# Patient Record
Sex: Male | Born: 1965 | Race: White | Hispanic: No | Marital: Married | Smoking: Never smoker
Health system: Southern US, Community
[De-identification: ages and names within clinical notes are randomized; demographics above are authoritative.]

## PROBLEM LIST (undated history)

## (undated) DIAGNOSIS — K219 Gastro-esophageal reflux disease without esophagitis: Secondary | ICD-10-CM

## (undated) DIAGNOSIS — R1011 Right upper quadrant pain: Secondary | ICD-10-CM

## (undated) DIAGNOSIS — R194 Change in bowel habit: Secondary | ICD-10-CM

## (undated) DIAGNOSIS — F32A Depression, unspecified: Secondary | ICD-10-CM

## (undated) DIAGNOSIS — K641 Second degree hemorrhoids: Secondary | ICD-10-CM

## (undated) DIAGNOSIS — K31A0 Gastric intestinal metaplasia, unspecified: Secondary | ICD-10-CM

## (undated) DIAGNOSIS — K2289 Other specified disease of esophagus: Secondary | ICD-10-CM

## (undated) DIAGNOSIS — I10 Essential (primary) hypertension: Secondary | ICD-10-CM

## (undated) DIAGNOSIS — F4310 Post-traumatic stress disorder, unspecified: Secondary | ICD-10-CM

## (undated) DIAGNOSIS — K2970 Gastritis, unspecified, without bleeding: Secondary | ICD-10-CM

## (undated) DIAGNOSIS — K3189 Other diseases of stomach and duodenum: Secondary | ICD-10-CM

## (undated) DIAGNOSIS — F329 Major depressive disorder, single episode, unspecified: Secondary | ICD-10-CM

## (undated) DIAGNOSIS — R109 Unspecified abdominal pain: Secondary | ICD-10-CM

## (undated) DIAGNOSIS — B019 Varicella without complication: Secondary | ICD-10-CM

## (undated) HISTORY — DX: Other diseases of stomach and duodenum: K31.89

## (undated) HISTORY — DX: Change in bowel habit: R19.4

## (undated) HISTORY — DX: Unspecified abdominal pain: R10.9

## (undated) HISTORY — DX: Second degree hemorrhoids: K64.1

## (undated) HISTORY — PX: HERNIA REPAIR: SHX51

## (undated) HISTORY — PX: TONSILLECTOMY: SUR1361

## (undated) HISTORY — DX: Gastritis, unspecified, without bleeding: K29.70

## (undated) HISTORY — DX: Right upper quadrant pain: R10.11

## (undated) HISTORY — DX: Major depressive disorder, single episode, unspecified: F32.9

## (undated) HISTORY — PX: WRIST SURGERY: SHX841

## (undated) HISTORY — DX: Post-traumatic stress disorder, unspecified: F43.10

## (undated) HISTORY — DX: Varicella without complication: B01.9

## (undated) HISTORY — DX: Gastric intestinal metaplasia, unspecified: K31.A0

## (undated) HISTORY — DX: Gastro-esophageal reflux disease without esophagitis: K21.9

## (undated) HISTORY — DX: Other specified disease of esophagus: K22.89

## (undated) HISTORY — DX: Depression, unspecified: F32.A

---

## 2012-05-14 ENCOUNTER — Emergency Department: Payer: Self-pay | Admitting: Internal Medicine

## 2012-05-14 ENCOUNTER — Emergency Department: Payer: Self-pay | Admitting: Emergency Medicine

## 2012-05-22 ENCOUNTER — Ambulatory Visit: Payer: Self-pay | Admitting: General Practice

## 2012-05-29 ENCOUNTER — Ambulatory Visit: Payer: Self-pay | Admitting: General Practice

## 2013-02-09 ENCOUNTER — Ambulatory Visit (INDEPENDENT_AMBULATORY_CARE_PROVIDER_SITE_OTHER): Payer: No Typology Code available for payment source

## 2013-02-09 ENCOUNTER — Ambulatory Visit (INDEPENDENT_AMBULATORY_CARE_PROVIDER_SITE_OTHER): Payer: No Typology Code available for payment source | Admitting: Podiatry

## 2013-02-09 ENCOUNTER — Encounter: Payer: Self-pay | Admitting: Podiatry

## 2013-02-09 VITALS — BP 133/72 | HR 78 | Resp 16 | Ht 66.0 in | Wt 165.0 lb

## 2013-02-09 DIAGNOSIS — M79609 Pain in unspecified limb: Secondary | ICD-10-CM

## 2013-02-09 DIAGNOSIS — M7750 Other enthesopathy of unspecified foot: Secondary | ICD-10-CM

## 2013-02-09 DIAGNOSIS — M79672 Pain in left foot: Secondary | ICD-10-CM

## 2013-02-09 DIAGNOSIS — M722 Plantar fascial fibromatosis: Secondary | ICD-10-CM

## 2013-02-09 MED ORDER — DICLOFENAC SODIUM 75 MG PO TBEC: 75.0000 mg | DELAYED_RELEASE_TABLET | Freq: Two times a day (BID) | ORAL | Status: DC

## 2013-02-09 MED ORDER — TRIAMCINOLONE ACETONIDE 10 MG/ML IJ SUSP
10.0000 mg | Freq: Once | INTRAMUSCULAR | Status: AC
  Administered 2013-02-09: 10 mg
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
## 2013-02-23 ENCOUNTER — Ambulatory Visit (INDEPENDENT_AMBULATORY_CARE_PROVIDER_SITE_OTHER): Payer: No Typology Code available for payment source | Admitting: Podiatry

## 2013-02-23 ENCOUNTER — Encounter: Payer: Self-pay | Admitting: Podiatry

## 2013-02-23 VITALS — BP 135/88 | HR 69 | Resp 16

## 2013-02-23 DIAGNOSIS — M7750 Other enthesopathy of unspecified foot: Secondary | ICD-10-CM

## 2013-02-23 MED ORDER — TRIAMCINOLONE ACETONIDE 10 MG/ML IJ SUSP
10.0000 mg | Freq: Once | INTRAMUSCULAR | Status: AC
  Administered 2013-02-23: 10 mg

## 2013-02-23 NOTE — Progress Notes (Signed)
Subjective:     Patient ID: Nathan Fox, male   DOB: 08-May-1965, 48 y.o.   MRN: [id number]  HPI patient states that the outside of his left foot is feeling quite a bit better but there continues to be 1 spot that is irritated   Review of Systems     Objective:   Physical Exam Neurovascular status is intact no health history changes noted and continued discomfort at the peroneal insertion base fifth metatarsal left    Assessment:     Peroneal tendinitis base of fifth metatarsal left improved but present    Plan:     Instructed on ice therapy and today careful injection 3 mg Kenalog 5 mg Xylocaine Marcaine administered with instructions on him mobilization and reduced activity. Reappoint in 3 months and less symptoms should recur

## 2013-02-23 NOTE — Progress Notes (Signed)
   Subjective:    Patient ID: Nathan Fox, male    DOB: 1965/06/15, 48 y.o.   MRN: [id number]  HPI Comments: It was doing better up until a  Week ago , had to stop the anti inflammatory due upsetting my stomach, its better than it was but its slow going back      Review of Systems     Objective:   Physical Exam        Assessment & Plan:

## 2013-05-25 ENCOUNTER — Ambulatory Visit (INDEPENDENT_AMBULATORY_CARE_PROVIDER_SITE_OTHER): Payer: No Typology Code available for payment source | Admitting: Podiatry

## 2013-05-25 VITALS — Resp 16 | Ht 66.0 in | Wt 165.0 lb

## 2013-05-25 DIAGNOSIS — M7750 Other enthesopathy of unspecified foot: Secondary | ICD-10-CM

## 2013-05-25 NOTE — Progress Notes (Signed)
Subjective:     Patient ID: Nathan Fox, male   DOB: 1965/03/12, 48 y.o.   MRN: [id number]  HPI patient states left foot is doing quite a bit better with discomfort still noted if he moves it a certain way or presses against it deeply   Review of Systems     Objective:   Physical Exam Neurovascular status intact with inflammation and discomfort fifth metatarsal base left of a mild nature    Assessment:     Tendinitis left foot that continues to improve    Plan:     Advised on physical therapy anti-inflammatories and supportive shoe gear usage. Discharge at this time

## 2014-08-30 ENCOUNTER — Other Ambulatory Visit: Payer: Self-pay

## 2014-08-30 ENCOUNTER — Ambulatory Visit (INDEPENDENT_AMBULATORY_CARE_PROVIDER_SITE_OTHER): Payer: PRIVATE HEALTH INSURANCE | Admitting: Gastroenterology

## 2014-08-30 ENCOUNTER — Encounter: Payer: Self-pay | Admitting: Gastroenterology

## 2014-08-30 VITALS — BP 145/85 | HR 71 | Temp 98.1°F | Ht 66.0 in | Wt 174.0 lb

## 2014-08-30 DIAGNOSIS — G8929 Other chronic pain: Secondary | ICD-10-CM

## 2014-08-30 DIAGNOSIS — R1031 Right lower quadrant pain: Secondary | ICD-10-CM

## 2014-08-30 DIAGNOSIS — I10 Essential (primary) hypertension: Secondary | ICD-10-CM | POA: Insufficient documentation

## 2014-08-30 DIAGNOSIS — R1032 Left lower quadrant pain: Secondary | ICD-10-CM

## 2014-08-30 DIAGNOSIS — R194 Change in bowel habit: Secondary | ICD-10-CM

## 2014-08-30 DIAGNOSIS — K219 Gastro-esophageal reflux disease without esophagitis: Secondary | ICD-10-CM

## 2014-08-30 HISTORY — DX: Gastro-esophageal reflux disease without esophagitis: K21.9

## 2014-08-30 NOTE — Progress Notes (Signed)
Gastroenterology Consultation  Referring Provider:     Faythe GheeFisher, Susan W, PA-C Primary Care Physician:  Faythe GheeFISHER,SUSAN W, PA-C Primary Gastroenterologist:  Dr. Servando SnareWohl     Reason for Consultation:     Abdominal pain and change in bowel habits        HPI:   Nathan Fox is a 49 y.o. y/o male referred for consultation & management of abdominal pain and change in bowel habits by Dr. Faythe GheeFISHER,SUSAN W, PA-C.  This patient comes today with a report of 1 month of increased abdominal pain that has gotten better with omeprazole but he still continues to have some tightness and bloating in his abdomen. The pain is mostly in lower abdomen and can be made better with eating and moving his bowels. There is no report of any unexplained weight loss. The patient does report that he has had increased stress with his mother-in-law moving in with him one month ago when she has Alzheimer's. The patient denies any black stools or bloody stools but he states that his stools have been much more loose than they had in the past. He reports he only has one to 3 bowel movements a day. He sometimes gets attacks where he has bloating and abdominal pain and has to move his bowels 4 times and then he feels much better. There is no report of any black stools or bloody stools.  Past Medical History  Diagnosis Date  . PTSD (post-traumatic stress disorder)   . GERD (gastroesophageal reflux disease)     Past Surgical History  Procedure Laterality Date  . Hernia repair      x 2   . Tonsillectomy    . Wrist surgery Left     cyst    Prior to Admission medications   Medication Sig Start Date End Date Taking? Authorizing Provider  lisinopril (PRINIVIL,ZESTRIL) 10 MG tablet Take 10 mg by mouth daily.   Yes Historical Provider, MD  omeprazole (PRILOSEC) 20 MG capsule Take 20 mg by mouth daily.   Yes Historical Provider, MD  ALPRAZolam Prudy Feeler(XANAX) 0.25 MG tablet Take 0.25 mg by mouth at bedtime as needed for anxiety.    Historical  Provider, MD  hydrOXYzine (ATARAX/VISTARIL) 25 MG tablet Take by mouth. 06/15/14   Historical Provider, MD  PARoxetine (PAXIL) 20 MG tablet Take 20 mg by mouth daily.    Historical Provider, MD    Family History  Problem Relation Age of Onset  . Hypertension Mother   . Stroke Mother   . Arthritis Mother   . Asthma Mother   . Asthma Maternal Grandmother      History  Substance Use Topics  . Smoking status: Never Smoker   . Smokeless tobacco: Current User    Types: Snuff  . Alcohol Use: Yes    Allergies as of 08/30/2014  . (No Known Allergies)    Review of Systems:    All systems reviewed and negative except where noted in HPI.   Physical Exam:  BP 145/85 mmHg  Pulse 71  Temp(Src) 98.1 F (36.7 C) (Oral)  Ht 5\' 6"  (1.676 m)  Wt 174 lb (78.926 kg)  BMI 28.10 kg/m2 No LMP for male patient. Psych:  Alert and cooperative. Normal mood and affect. General:   Alert,  Well-developed, well-nourished, pleasant and cooperative in NAD Head:  Normocephalic and atraumatic. Eyes:  Sclera clear, no icterus.   Conjunctiva pink. Ears:  Normal auditory acuity. Nose:  No deformity, discharge, or lesions. Mouth:  No  deformity or lesions,oropharynx pink & moist. Neck:  Supple; no masses or thyromegaly. Lungs:  Respirations even and unlabored.  Clear throughout to auscultation.   No wheezes, crackles, or rhonchi. No acute distress. Heart:  Regular rate and rhythm; no murmurs, clicks, rubs, or gallops. Abdomen:  Normal bowel sounds.  No bruits.  Soft, mild lower abdominal tenderness and non-distended without masses, hepatosplenomegaly or hernias noted.  No guarding or rebound tenderness.  Negative Carnett sign.   Rectal:  Deferred.  Msk:  Symmetrical without gross deformities.  Good, equal movement & strength bilaterally. Pulses:  Normal pulses noted. Extremities:  No clubbing or edema.  No cyanosis. Neurologic:  Alert and oriented x3;  grossly normal neurologically. Skin:  Intact without  significant lesions or rashes.  No jaundice. Lymph Nodes:  No significant cervical adenopathy. Psych:  Alert and cooperative. Normal mood and affect.  Imaging Studies: No results found.  Assessment and Plan:   [name] is a 49 y.o. y/o male who comes in today with a change in bowel habits with more loose stools and abdominal pain in the lower abdomen with a history of some dyspepsia. The patient will be set up for an EGD and colonoscopy. The patient denies any family history of colon cancer colon polyps. There is also no report of any black stools or bloody stools. The patient has been feeling better with Prilosec and will continue taking the Prilosec although he has had 1 episode of abdominal pain with tightness since starting the omeprazole. I have discussed risks & benefits which include, but are not limited to, bleeding, infection, perforation & drug reaction.  The patient agrees with this plan & written consent will be obtained.

## 2014-09-15 ENCOUNTER — Encounter: Payer: Self-pay | Admitting: *Deleted

## 2014-09-21 NOTE — Discharge Instructions (Signed)

## 2014-09-23 ENCOUNTER — Ambulatory Visit
Admission: RE | Admit: 2014-09-23 | Discharge: 2014-09-23 | Disposition: A | Discharge status: Home / Self Care | Payer: PRIVATE HEALTH INSURANCE | Source: Ambulatory Visit | Attending: Gastroenterology | Admitting: Gastroenterology

## 2014-09-23 ENCOUNTER — Other Ambulatory Visit: Payer: Self-pay | Admitting: Gastroenterology

## 2014-09-23 ENCOUNTER — Encounter
Admission: RE | Disposition: A | Discharge status: Home / Self Care | Payer: Self-pay | Source: Ambulatory Visit | Attending: Gastroenterology

## 2014-09-23 ENCOUNTER — Ambulatory Visit: Payer: PRIVATE HEALTH INSURANCE | Admitting: Anesthesiology

## 2014-09-23 ENCOUNTER — Encounter: Payer: Self-pay | Admitting: Anesthesiology

## 2014-09-23 DIAGNOSIS — Z79899 Other long term (current) drug therapy: Secondary | ICD-10-CM | POA: Diagnosis not present

## 2014-09-23 DIAGNOSIS — Z720 Tobacco use: Secondary | ICD-10-CM | POA: Insufficient documentation

## 2014-09-23 DIAGNOSIS — Z825 Family history of asthma and other chronic lower respiratory diseases: Secondary | ICD-10-CM | POA: Diagnosis not present

## 2014-09-23 DIAGNOSIS — K30 Functional dyspepsia: Secondary | ICD-10-CM

## 2014-09-23 DIAGNOSIS — R1013 Epigastric pain: Secondary | ICD-10-CM | POA: Insufficient documentation

## 2014-09-23 DIAGNOSIS — I10 Essential (primary) hypertension: Secondary | ICD-10-CM | POA: Insufficient documentation

## 2014-09-23 DIAGNOSIS — F4310 Post-traumatic stress disorder, unspecified: Secondary | ICD-10-CM | POA: Diagnosis not present

## 2014-09-23 DIAGNOSIS — Z823 Family history of stroke: Secondary | ICD-10-CM | POA: Insufficient documentation

## 2014-09-23 DIAGNOSIS — Z8261 Family history of arthritis: Secondary | ICD-10-CM | POA: Diagnosis not present

## 2014-09-23 DIAGNOSIS — K219 Gastro-esophageal reflux disease without esophagitis: Secondary | ICD-10-CM | POA: Diagnosis not present

## 2014-09-23 DIAGNOSIS — K641 Second degree hemorrhoids: Secondary | ICD-10-CM | POA: Diagnosis not present

## 2014-09-23 DIAGNOSIS — R194 Change in bowel habit: Secondary | ICD-10-CM

## 2014-09-23 DIAGNOSIS — K2970 Gastritis, unspecified, without bleeding: Secondary | ICD-10-CM | POA: Insufficient documentation

## 2014-09-23 DIAGNOSIS — Z8249 Family history of ischemic heart disease and other diseases of the circulatory system: Secondary | ICD-10-CM | POA: Insufficient documentation

## 2014-09-23 DIAGNOSIS — R109 Unspecified abdominal pain: Secondary | ICD-10-CM | POA: Insufficient documentation

## 2014-09-23 HISTORY — DX: Essential (primary) hypertension: I10

## 2014-09-23 HISTORY — PX: COLONOSCOPY WITH PROPOFOL: SHX5780

## 2014-09-23 HISTORY — PX: ESOPHAGOGASTRODUODENOSCOPY (EGD) WITH PROPOFOL: SHX5813

## 2014-09-23 SURGERY — COLONOSCOPY WITH PROPOFOL
Anesthesia: Monitor Anesthesia Care | Wound class: Contaminated

## 2014-09-23 MED ORDER — LIDOCAINE HCL (CARDIAC) 20 MG/ML IV SOLN
INTRAVENOUS | Status: DC | PRN
  Administered 2014-09-23: 30 mg via INTRAVENOUS

## 2014-09-23 MED ORDER — OXYCODONE HCL 5 MG/5ML PO SOLN: 5.0000 mg | Freq: Once | ORAL | Status: DC | PRN

## 2014-09-23 MED ORDER — OXYCODONE HCL 5 MG PO TABS: 5.0000 mg | ORAL_TABLET | Freq: Once | ORAL | Status: DC | PRN

## 2014-09-23 MED ORDER — PROPOFOL 10 MG/ML IV BOLUS
INTRAVENOUS | Status: DC | PRN
  Administered 2014-09-23 (×7): 20 mg via INTRAVENOUS

## 2014-09-23 MED ORDER — LACTATED RINGERS IV SOLN
INTRAVENOUS | Status: DC
  Administered 2014-09-23 (×2): via INTRAVENOUS

## 2014-09-23 MED ORDER — SODIUM CHLORIDE 0.9 % IV SOLN: INTRAVENOUS | Status: DC

## 2014-09-23 SURGICAL SUPPLY — 39 items
BALLN DILATOR 10-12 8 (BALLOONS)
BALLN DILATOR 12-15 8 (BALLOONS)
BALLN DILATOR 15-18 8 (BALLOONS)
BALLN DILATOR CRE 0-12 8 (BALLOONS)
BALLN DILATOR ESOPH 8 10 CRE (MISCELLANEOUS) IMPLANT
BALLOON DILATOR 12-15 8 (BALLOONS) IMPLANT
BALLOON DILATOR 15-18 8 (BALLOONS) IMPLANT
BALLOON DILATOR CRE 0-12 8 (BALLOONS) IMPLANT
BLOCK BITE 60FR ADLT L/F GRN (MISCELLANEOUS) ×3 IMPLANT
CANISTER SUCT 1200ML W/VALVE (MISCELLANEOUS) ×3 IMPLANT
FCP ESCP3.2XJMB 240X2.8X (MISCELLANEOUS)
FORCEPS BIOP RAD 4 LRG CAP 4 (CUTTING FORCEPS) IMPLANT
FORCEPS BIOP RJ4 240 W/NDL (MISCELLANEOUS)
FORCEPS ESCP3.2XJMB 240X2.8X (MISCELLANEOUS) IMPLANT
GOWN CVR UNV OPN BCK APRN NK (MISCELLANEOUS) ×2 IMPLANT
GOWN ISOL THUMB LOOP REG UNIV (MISCELLANEOUS) ×4
HEMOCLIP INSTINCT (CLIP) IMPLANT
INJECTOR VARIJECT VIN23 (MISCELLANEOUS) IMPLANT
KIT CO2 TUBING (TUBING) IMPLANT
KIT DEFENDO VALVE AND CONN (KITS) IMPLANT
KIT ENDO PROCEDURE OLY (KITS) ×3 IMPLANT
LIGATOR MULTIBAND 6SHOOTER MBL (MISCELLANEOUS) IMPLANT
MARKER SPOT ENDO TATTOO 5ML (MISCELLANEOUS) IMPLANT
PAD GROUND ADULT SPLIT (MISCELLANEOUS) IMPLANT
SNARE SHORT THROW 13M SML OVAL (MISCELLANEOUS) IMPLANT
SNARE SHORT THROW 30M LRG OVAL (MISCELLANEOUS) IMPLANT
SPOT EX ENDOSCOPIC TATTOO (MISCELLANEOUS)
SUCTION POLY TRAP 4CHAMBER (MISCELLANEOUS) IMPLANT
SYR INFLATION 60ML (SYRINGE) IMPLANT
TRAP SUCTION POLY (MISCELLANEOUS) IMPLANT
TUBING CONN 6MMX3.1M (TUBING)
TUBING SUCTION CONN 0.25 STRL (TUBING) IMPLANT
UNDERPAD 30X60 958B10 (PK) (MISCELLANEOUS) IMPLANT
VALVE BIOPSY ENDO (VALVE) IMPLANT
VARIJECT INJECTOR VIN23 (MISCELLANEOUS)
WATER AUXILLARY (MISCELLANEOUS) IMPLANT
WATER STERILE IRR 250ML POUR (IV SOLUTION) ×3 IMPLANT
WATER STERILE IRR 500ML POUR (IV SOLUTION) IMPLANT
WIRE CRE 18-20MM 8CM F G (MISCELLANEOUS) IMPLANT

## 2014-09-23 NOTE — Anesthesia Postprocedure Evaluation (Signed)
  Anesthesia Post-op Note  Patient: Nathan Fox  Procedure(s) Performed: Procedure(s) with comments: COLONOSCOPY WITH PROPOFOL (N/A) ESOPHAGOGASTRODUODENOSCOPY (EGD) WITH PROPOFOL (N/A) - gastric biopsy  Anesthesia type:MAC  Patient location: PACU  Post pain: Pain level controlled  Post assessment: Post-op Vital signs reviewed, Patient's Cardiovascular Status Stable, Respiratory Function Stable, Patent Airway and No signs of Nausea or vomiting  Post vital signs: Reviewed and stable  Last Vitals:  Filed Vitals:   09/23/14 0933  BP:   Pulse: 68  Temp: 36.3 C  Resp: 20    Level of consciousness: awake, alert  and patient cooperative  Complications: No apparent anesthesia complications

## 2014-09-23 NOTE — Transfer of Care (Signed)
Immediate Anesthesia Transfer of Care Note  Patient: Nathan Fox  Procedure(s) Performed: Procedure(s) with comments: COLONOSCOPY WITH PROPOFOL (N/A) ESOPHAGOGASTRODUODENOSCOPY (EGD) WITH PROPOFOL (N/A) - gastric biopsy  Patient Location: PACU  Anesthesia Type: MAC  Level of Consciousness: awake, alert  and patient cooperative  Airway and Oxygen Therapy: Patient Spontanous Breathing and Patient connected to supplemental oxygen  Post-op Assessment: Post-op Vital signs reviewed, Patient's Cardiovascular Status Stable, Respiratory Function Stable, Patent Airway and No signs of Nausea or vomiting  Post-op Vital Signs: Reviewed and stable  Complications: No apparent anesthesia complications

## 2014-09-23 NOTE — Op Note (Signed)
[facility] Gastroenterology Patient Name: Nathan Fox Procedure Date: 09/23/2014 9:12 AM MRN: [id number] Account #: [id number] Date of Birth: 1965-10-30 Admit Type: Outpatient Age: 49 Room: [facility] OR ROOM 01 Gender: Male Note Status: Finalized Procedure:         Upper GI endoscopy Indications:       Dyspepsia Providers:         Midge Minium, MD Referring MD:      Demetrios Isaacs. Sherrie Mustache, MD (Referring MD) Medicines:         Propofol per Anesthesia Complications:     No immediate complications. Procedure:         Pre-Anesthesia Assessment:                    - Prior to the procedure, a History and Physical was                     performed, and patient medications and allergies were                     reviewed. The patient's tolerance of previous anesthesia                     was also reviewed. The risks and benefits of the procedure                     and the sedation options and risks were discussed with the                     patient. All questions were answered, and informed consent                     was obtained. Prior Anticoagulants: The patient has taken                     no previous anticoagulant or antiplatelet agents. ASA                     Grade Assessment: II - A patient with mild systemic                     disease. After reviewing the risks and benefits, the                     patient was deemed in satisfactory condition to undergo                     the procedure.                    After obtaining informed consent, the endoscope was passed                     under direct vision. Throughout the procedure, the                     patient's blood pressure, pulse, and oxygen saturations                     were monitored continuously. The Olympus GIF-HQ190                     Endoscope (S#. [id number]) was introduced through the mouth,  and advanced to the second part of duodenum. The upper GI                     endoscopy was  accomplished without difficulty. The patient                     tolerated the procedure well. Findings:      The examined esophagus was normal.      Scattered mild inflammation characterized by erythema was found in the       gastric antrum. Biopsies were taken with a cold forceps for histology.      The examined duodenum was normal. Impression:        - Normal esophagus.                    - Gastritis. Biopsied.                    - Normal examined duodenum. Recommendation:    - Await pathology results. Procedure Code(s): --- Professional ---                    [phone], Esophagogastroduodenoscopy, flexible, transoral;                     with biopsy, single or multiple Diagnosis Code(s): --- Professional ---                    K30, Functional dyspepsia                    K29.70, Gastritis, unspecified, without bleeding CPT copyright 2014 American Medical Association. All rights reserved. The codes documented in this report are preliminary and upon coder review may  be revised to meet current compliance requirements. Midge Minium, MD 09/23/2014 9:21:31 AM This report has been signed electronically. Number of Addenda: 0 Note Initiated On: 09/23/2014 9:12 AM Total Procedure Duration: 0 hours 2 minutes 37 seconds       [facility]

## 2014-09-23 NOTE — Op Note (Signed)
[facility] Gastroenterology Patient Name: Nathan Fox Procedure Date: 09/23/2014 9:11 AM MRN: [id number] Account #: [id number] Date of Birth: 1965-10-12 Admit Type: Outpatient Age: 49 Room: [facility] OR ROOM 01 Gender: Male Note Status: Finalized Procedure:         Colonoscopy Indications:       Change in bowel habits Providers:         Midge Minium, MD Medicines:         Propofol per Anesthesia Complications:     No immediate complications. Procedure:         Pre-Anesthesia Assessment:                    - Prior to the procedure, a History and Physical was                     performed, and patient medications and allergies were                     reviewed. The patient's tolerance of previous anesthesia                     was also reviewed. The risks and benefits of the procedure                     and the sedation options and risks were discussed with the                     patient. All questions were answered, and informed consent                     was obtained. Prior Anticoagulants: The patient has taken                     no previous anticoagulant or antiplatelet agents. ASA                     Grade Assessment: II - A patient with mild systemic                     disease. After reviewing the risks and benefits, the                     patient was deemed in satisfactory condition to undergo                     the procedure.                    After obtaining informed consent, the colonoscope was                     passed under direct vision. Throughout the procedure, the                     patient's blood pressure, pulse, and oxygen saturations                     were monitored continuously. The Olympus CF-HQ190L                     Colonoscope (S#. [id number]) was introduced through the anus                     and advanced to the the cecum, identified by appendiceal  orifice and ileocecal valve. The colonoscopy was performed                without difficulty. The patient tolerated the procedure                     well. The quality of the bowel preparation was excellent. Findings:      The perianal and digital rectal examinations were normal.      Non-bleeding internal hemorrhoids were found during retroflexion. The       hemorrhoids were Grade II (internal hemorrhoids that prolapse but reduce       spontaneously).      Random biopsies were obtained with cold forceps for histology randomly. Impression:        - Non-bleeding internal hemorrhoids.                    - Random biopsies were obtained. Recommendation:    - Repeat colonoscopy in 10 years for screening unless any                     change in family history or lower GI problems. Procedure Code(s): --- Professional ---                    [phone], Colonoscopy, flexible; with biopsy, single or                     multiple Diagnosis Code(s): --- Professional ---                    R19.4, Change in bowel habit                    K64.1, Second degree hemorrhoids CPT copyright 2014 American Medical Association. All rights reserved. The codes documented in this report are preliminary and upon coder review may  be revised to meet current compliance requirements. Midge Minium, MD 09/23/2014 9:33:32 AM This report has been signed electronically. Number of Addenda: 0 Note Initiated On: 09/23/2014 9:11 AM Scope Withdrawal Time: 0 hours 6 minutes 11 seconds  Total Procedure Duration: 0 hours 7 minutes 19 seconds       [facility]

## 2014-09-23 NOTE — H&P (Signed)
  [facility]  [address], [address] [place], [place] [address] Phone: [phone] Fax : [phone]  Primary Care Physician:  [name], PA-C Primary Gastroenterologist:  Dr. Servando Snare  Pre-Procedure History & Physical: HPI:  Nathan Fox is a 49 y.o. male is here for an endoscopy and colonoscopy.   Past Medical History  Diagnosis Date  . PTSD (post-traumatic stress disorder)   . GERD (gastroesophageal reflux disease)   . Hypertension     Past Surgical History  Procedure Laterality Date  . Hernia repair      x 2   . Tonsillectomy    . Wrist surgery Left     cyst    Prior to Admission medications   Medication Sig Start Date End Date Taking? Authorizing Provider  lisinopril (PRINIVIL,ZESTRIL) 10 MG tablet Take 10 mg by mouth daily.   Yes Historical Provider, MD  omeprazole (PRILOSEC) 20 MG capsule Take 20 mg by mouth daily.   Yes Historical Provider, MD  ALPRAZolam Prudy Feeler) 0.25 MG tablet Take 0.25 mg by mouth at bedtime as needed for anxiety.    Historical Provider, MD  hydrOXYzine (ATARAX/VISTARIL) 25 MG tablet Take by mouth. 06/15/14   Historical Provider, MD  PARoxetine (PAXIL) 20 MG tablet Take 20 mg by mouth daily.    Historical Provider, MD    Allergies as of 08/30/2014  . (No Known Allergies)    Family History  Problem Relation Age of Onset  . Hypertension Mother   . Stroke Mother   . Arthritis Mother   . Asthma Mother   . Asthma Maternal Grandmother     History   Social History  . Marital Status: Married    Spouse Name: N/A  . Number of Children: N/A  . Years of Education: N/A   Occupational History  . Not on file.   Social History Main Topics  . Smoking status: Never Smoker   . Smokeless tobacco: Current User    Types: Snuff  . Alcohol Use: Yes     Comment: occas.  . Drug Use: No  . Sexual Activity: Not on file   Other Topics Concern  . Not on file   Social History Narrative    Review of Systems: See HPI, otherwise  negative ROS  Physical Exam: BP 147/91 mmHg  Pulse 61  Temp(Src) 98.6 F (37 C) (Temporal)  Resp 16  Ht  (1.676 m)  Wt 165 lb (74.844 kg)  BMI 26.64 kg/m2  SpO2 100% General:   Alert,  pleasant and cooperative in NAD Head:  Normocephalic and atraumatic. Neck:  Supple; no masses or thyromegaly. Lungs:  Clear throughout to auscultation.    Heart:  Regular rate and rhythm. Abdomen:  Soft, nontender and nondistended. Normal bowel sounds, without guarding, and without rebound.   Neurologic:  Alert and  oriented x4;  grossly normal neurologically.  Impression/Plan: Nathan Fox is here for an endoscopy and colonoscopy to be performed for dyspepsia and change of bowel habits  Risks, benefits, limitations, and alternatives regarding  endoscopy and colonoscopy have been reviewed with the patient.  Questions have been answered.  All parties agreeable.   Darlina Rumpf, MD  09/23/2014, 8:52 AM

## 2014-09-23 NOTE — Anesthesia Preprocedure Evaluation (Signed)
Anesthesia Evaluation  Patient identified by MRN, date of birth, ID band  Reviewed: NPO status   History of Anesthesia Complications Negative for: history of anesthetic complications  Airway Mallampati: II  TM Distance: >3 FB Neck ROM: full    Dental no notable dental hx.    Pulmonary neg pulmonary ROS,    Pulmonary exam normal       Cardiovascular Exercise Tolerance: Good hypertension, Normal cardiovascular exam    Neuro/Psych PSYCHIATRIC DISORDERS (ptsd) negative neurological ROS  negative psych ROS   GI/Hepatic Neg liver ROS, GERD-  Medicated and Controlled,  Endo/Other  negative endocrine ROS  Renal/GU negative Renal ROS  negative genitourinary   Musculoskeletal   Abdominal   Peds  Hematology negative hematology ROS (+)   Anesthesia Other Findings   Reproductive/Obstetrics                             Anesthesia Physical Anesthesia Plan  ASA: II  Anesthesia Plan: MAC   Post-op Pain Management:    Induction:   Airway Management Planned:   Additional Equipment:   Intra-op Plan:   Post-operative Plan:   Informed Consent: I have reviewed the patients History and Physical, chart, labs and discussed the procedure including the risks, benefits and alternatives for the proposed anesthesia with the patient or authorized representative who has indicated his/her understanding and acceptance.     Plan Discussed with: CRNA  Anesthesia Plan Comments:         Anesthesia Quick Evaluation

## 2014-09-26 ENCOUNTER — Encounter: Payer: Self-pay | Admitting: Gastroenterology

## 2014-09-29 ENCOUNTER — Telehealth: Payer: Self-pay

## 2014-09-29 NOTE — Telephone Encounter (Signed)
-----   Message from Midge Minium, MD sent at 09/28/2014 11:22 AM EDT ----- Please have the patient come in to discuss the pathology results.

## 2014-09-29 NOTE — Telephone Encounter (Signed)
-----   Message from Darren Wohl, MD sent at 09/28/2014 11:22 AM EDT ----- Please have the patient come in to discuss the pathology results. 

## 2014-09-29 NOTE — Telephone Encounter (Signed)
LVM for pt to return my call and schedule follow up appt.

## 2014-11-07 ENCOUNTER — Ambulatory Visit (INDEPENDENT_AMBULATORY_CARE_PROVIDER_SITE_OTHER): Payer: PRIVATE HEALTH INSURANCE | Admitting: Gastroenterology

## 2014-11-07 ENCOUNTER — Encounter: Payer: Self-pay | Admitting: Gastroenterology

## 2014-11-07 VITALS — BP 146/79 | HR 61 | Temp 98.4°F | Ht 66.0 in | Wt 178.0 lb

## 2014-11-07 DIAGNOSIS — R1013 Epigastric pain: Secondary | ICD-10-CM

## 2014-11-07 NOTE — Progress Notes (Signed)
   Primary Care Physician: Greig Right, PA-C  Primary Gastroenterologist:  Dr. Midge Minium  Chief Complaint  Patient presents with  . Discuss procedure results    HPI: Nathan Fox is a 49 y.o. male here for follow-up after having an EGD and colonoscopy. The patient was found to have focal intestinal metaplasia in the stomach. He now reports that he has some tingling sensation on the right side of his abdomen that he did not have before and a pulling sensation. The patient's colonoscopy did not show any cause for his change in bowel habits.  Current Outpatient Prescriptions  Medication Sig Dispense Refill  . ALPRAZolam (XANAX) 0.25 MG tablet Take 0.25 mg by mouth at bedtime as needed for anxiety.    Marland Kitchen lisinopril (PRINIVIL,ZESTRIL) 10 MG tablet Take 10 mg by mouth daily.    Marland Kitchen omeprazole (PRILOSEC) 20 MG capsule Take 20 mg by mouth daily.    . hydrOXYzine (ATARAX/VISTARIL) 25 MG tablet Take by mouth.    Marland Kitchen PARoxetine (PAXIL) 20 MG tablet Take 20 mg by mouth daily.     No current facility-administered medications for this visit.    Allergies as of 11/07/2014  . (No Known Allergies)    ROS:  General: Negative for anorexia, weight loss, fever, chills, fatigue, weakness. ENT: Negative for hoarseness, difficulty swallowing , nasal congestion. CV: Negative for chest pain, angina, palpitations, dyspnea on exertion, peripheral edema.  Respiratory: Negative for dyspnea at rest, dyspnea on exertion, cough, sputum, wheezing.  GI: See history of present illness. GU:  Negative for dysuria, hematuria, urinary incontinence, urinary frequency, nocturnal urination.  Endo: Negative for unusual weight change.    Physical Examination:   BP 146/79 mmHg  Pulse 61  Temp(Src) 98.4 F (36.9 C) (Oral)  Ht  (1.676 m)  Wt 178 lb (80.74 kg)  BMI 28.74 kg/m2  General: Well-nourished, well-developed in no acute distress.  Eyes: No icterus. Conjunctivae pink. Mouth: Oropharyngeal mucosa moist  and pink , no lesions erythema or exudate. Lungs: Clear to auscultation bilaterally. Non-labored. Heart: Regular rate and rhythm, no murmurs rubs or gallops.  Abdomen: Bowel sounds are normal, nontender, nondistended, no hepatosplenomegaly or masses, no abdominal bruits or hernia , no rebound or guarding.   Extremities: No lower extremity edema. No clubbing or deformities. Neuro: Alert and oriented x 3.  Grossly intact. Skin: Warm and dry, no jaundice.   Psych: Alert and cooperative, normal mood and affect.  Labs:    Imaging Studies: No results found.  Assessment and Plan:   Nathan Fox is a 49 y.o. y/o male  who comes in today with who reports that his dyspepsia that he was seen for the past has resolved. He is now having tingling and burning on the right side of his abdomen that feels like a pulling sensation. The patient has been told that if this feeling does not resolve we can consider a right upper quadrant ultrasound. He has also been informed about the pathology results of focal intestinal metaplasia in his stomach.He will have his blood sent off for H. Pylori antibodies although the biopsy was negative for H. Pylori. The patient will contact me if he has any further problems.   Note: This dictation was prepared with Dragon dictation along with smaller phrase technology. Any transcriptional errors that result from this process are unintentional.

## 2014-11-08 ENCOUNTER — Encounter: Payer: Self-pay | Admitting: Gastroenterology

## 2014-11-09 LAB — H PYLORI, IGM, IGG, IGA AB: H. pylori, IgA Abs: <9 units (ref 0.0–8.9)

## 2015-01-06 ENCOUNTER — Encounter: Payer: Self-pay | Admitting: Physician Assistant

## 2015-01-06 ENCOUNTER — Ambulatory Visit: Payer: Self-pay | Admitting: Physician Assistant

## 2015-01-06 VITALS — BP 140/90 | HR 72 | Temp 97.8°F

## 2015-01-06 DIAGNOSIS — F410 Panic disorder [episodic paroxysmal anxiety] without agoraphobia: Secondary | ICD-10-CM

## 2015-01-06 DIAGNOSIS — F439 Reaction to severe stress, unspecified: Secondary | ICD-10-CM

## 2015-01-06 DIAGNOSIS — F329 Major depressive disorder, single episode, unspecified: Secondary | ICD-10-CM

## 2015-01-06 DIAGNOSIS — F32A Depression, unspecified: Secondary | ICD-10-CM

## 2015-01-06 MED ORDER — ALPRAZOLAM 0.5 MG PO TABS: 0.5000 mg | ORAL_TABLET | Freq: Two times a day (BID) | ORAL | Status: DC | PRN

## 2015-01-06 MED ORDER — PAROXETINE HCL 20 MG PO TABS: 20.0000 mg | ORAL_TABLET | Freq: Every day | ORAL | Status: DC

## 2015-01-06 NOTE — Addendum Note (Signed)
Addended by: Mirian MoMOORE, TOMMIE A on: 01/06/2015 12:17 PM   Modules accepted: Orders

## 2015-01-06 NOTE — Progress Notes (Signed)
S / has had panic attacks with elevated bp , low mood, irritability , and has been off of his paxil x 4 months, stress due to Mother in law resided with them and remodeling her historical house to move in to No H or S thoughts;His mom has problems with nerves.  O/ pleasant but anxious and tearful with conversation ,verbalises well VSS heart rsr lungs clear  A/ situational stress,depression, anxiety  P paxil 20 mg one daily, xanex 0.5 mg 1/2 to 1 tab  q 12 h prn #15 O rf EAP offered . He has gone in past , f/u in 3-4 weeks  Or sooner prn.

## 2015-02-01 ENCOUNTER — Other Ambulatory Visit: Payer: Self-pay | Admitting: Physician Assistant

## 2015-02-08 ENCOUNTER — Other Ambulatory Visit: Payer: Self-pay | Admitting: Emergency Medicine

## 2015-02-08 MED ORDER — PAROXETINE HCL 20 MG PO TABS: 20.0000 mg | ORAL_TABLET | Freq: Every day | ORAL | Status: DC

## 2015-02-08 MED ORDER — OMEPRAZOLE 20 MG PO CPDR: 20.0000 mg | DELAYED_RELEASE_CAPSULE | Freq: Every day | ORAL | Status: DC

## 2015-02-08 NOTE — Telephone Encounter (Signed)
Med refill approved 

## 2015-02-08 NOTE — Telephone Encounter (Signed)
Patient called and expressed that he needs a medication refill for Omeprazole and Paxil. He uses [facility] in [place].

## 2015-03-20 ENCOUNTER — Ambulatory Visit: Payer: Self-pay | Admitting: Physician Assistant

## 2015-03-20 ENCOUNTER — Encounter: Payer: Self-pay | Admitting: Physician Assistant

## 2015-03-20 VITALS — BP 140/80 | HR 66 | Temp 98.3°F

## 2015-03-20 DIAGNOSIS — R1013 Epigastric pain: Secondary | ICD-10-CM

## 2015-03-20 DIAGNOSIS — R002 Palpitations: Secondary | ICD-10-CM

## 2015-03-20 NOTE — Progress Notes (Signed)
S: c/o chest and epigastric pain, not associated with food or exertion, no fever/chills, no v/d, no sweating or radiation of pain, states has muscle pains all over trunk but they randomly move, has cut back on his paxil to 1/2 pill, since then has been having panic attacks and more anxiety, had upper gi done in July and was told there was irritation but nothing of significance  O: vitals wnl, nad, lungs c t a, cv rrr, abd soft minimally tender in epigastric, ruq, and rlq, bs normal all 4 quads, ekg same as in 2014; good mood and affect  A: anxiety, episgastric pain  P: continue omeprazole, add probiotic, magnesium and coq10 for muscle aches, increase paxil to full pill every other day

## 2015-05-02 ENCOUNTER — Encounter: Payer: Self-pay | Admitting: Physician Assistant

## 2015-05-02 ENCOUNTER — Ambulatory Visit
Admission: RE | Admit: 2015-05-02 | Discharge: 2015-05-02 | Disposition: A | Discharge status: Home / Self Care | Payer: Managed Care, Other (non HMO) | Source: Ambulatory Visit | Attending: Physician Assistant | Admitting: Physician Assistant

## 2015-05-02 ENCOUNTER — Ambulatory Visit: Payer: Self-pay | Admitting: Physician Assistant

## 2015-05-02 VITALS — BP 140/90 | HR 60 | Temp 98.6°F

## 2015-05-02 DIAGNOSIS — R0789 Other chest pain: Secondary | ICD-10-CM

## 2015-05-02 NOTE — Progress Notes (Signed)
S: c/o intermittent chest pain, area is sore on left breast, sometimes pain is on r side, worse when working, will occasionally radiate down his arm, denies sob, + fam hx breast lump, ?CA doesn't know for sure, had cardiac workup for chest pain which was neg, does have hx of back problems  O: vitals wnl, nad, lungs c t a, cv rrr, area at left breast tender, abd soft epigastric area is tender, n/v intact  A: nonspecific chest pain  P: cxr, if neg consider us of left breast area

## 2015-05-03 NOTE — Progress Notes (Signed)
See previous note

## 2015-05-05 NOTE — Progress Notes (Signed)
Yvonne KendallSandra Brown, CMA spoke with the patient about his xray report and he expressed understanding.  Referral to the orthopedics is pending.

## 2015-07-30 ENCOUNTER — Other Ambulatory Visit: Payer: Self-pay | Admitting: Physician Assistant

## 2015-08-01 NOTE — Telephone Encounter (Signed)
Refill x 1 approved, pt needs fasting labs

## 2015-08-30 ENCOUNTER — Other Ambulatory Visit: Payer: Self-pay | Admitting: Physician Assistant

## 2015-08-31 ENCOUNTER — Other Ambulatory Visit: Payer: Self-pay | Admitting: Physician Assistant

## 2016-01-19 ENCOUNTER — Ambulatory Visit: Payer: Self-pay | Admitting: Physician Assistant

## 2016-01-26 ENCOUNTER — Encounter: Payer: Self-pay | Admitting: Physician Assistant

## 2016-01-26 ENCOUNTER — Ambulatory Visit: Payer: Self-pay | Admitting: Physician Assistant

## 2016-01-26 VITALS — BP 144/84 | HR 62 | Temp 98.4°F | Ht 66.0 in | Wt 171.0 lb

## 2016-01-26 DIAGNOSIS — R0981 Nasal congestion: Secondary | ICD-10-CM

## 2016-01-26 DIAGNOSIS — B353 Tinea pedis: Secondary | ICD-10-CM

## 2016-01-26 DIAGNOSIS — Z0000 Encounter for general adult medical examination without abnormal findings: Secondary | ICD-10-CM

## 2016-01-26 MED ORDER — TERBINAFINE HCL 1 % EX CREA: 1.0000 "application " | TOPICAL_CREAM | Freq: Two times a day (BID) | CUTANEOUS | 0 refills | Status: DC

## 2016-01-26 MED ORDER — FEXOFENADINE-PSEUDOEPHED ER 60-120 MG PO TB12: 1.0000 | ORAL_TABLET | Freq: Two times a day (BID) | ORAL | 0 refills | Status: DC

## 2016-01-26 NOTE — Progress Notes (Deleted)
   Subjective:    Patient ID: Nathan Fox, male    DOB: 1965/08/31, 50 y.o.   MRN: [id number]  HPI    Review of Systems     Objective:   Physical Exam        Assessment & Plan:

## 2016-01-26 NOTE — Addendum Note (Signed)
Addended by: Catha BrowEACON, MONIQUE T on: 01/26/2016 03:08 PM   Modules accepted: Orders

## 2016-01-26 NOTE — Progress Notes (Signed)
   Subjective:Physical Exam/Sinus Congestion/Tinea pedis    Patient ID: Nathan DoveJohnny L Fox, male    DOB: 05-01-1965, 50 y.o.   MRN: [id number]  HPI Patient request routine physical exam. Patient c/o sinus congestion and fungal foot infection.   Review of Systems HTN, Sinus congestion    Objective:   Physical Exam HEENT bilateral maxillary guarding and edematous nasal turbinates.No spinal deformity. No obvious upper/lower extremities deformity. F/E ROM of spine and extremities. Normoactive bowel sounds. SNTTP. CNII-XII grosslly intact.     Assessment & Plan:Well Exam/ sinus congestion, and Tinea pedis  Allergra-D and Lamisil. Follow up PRN

## 2016-01-27 LAB — CMP12+LP+TP+TSH+6AC+PSA+CBC…
ALBUMIN: 4.4 g/dL (ref 3.5–5.5)
ALT: 22 IU/L (ref 0–44)
AST: 14 IU/L (ref 0–40)
Albumin/Globulin Ratio: 1.9 (ref 1.2–2.2)
Alkaline Phosphatase: 72 IU/L (ref 39–117)
BASOS ABS: 0 10*3/uL (ref 0.0–0.2)
BILIRUBIN TOTAL: 0.3 mg/dL (ref 0.0–1.2)
BUN / CREAT RATIO: 13 (ref 9–20)
BUN: 14 mg/dL (ref 6–24)
Basos: 0 %
CALCIUM: 9.2 mg/dL (ref 8.7–10.2)
CHLORIDE: 102 mmol/L (ref 96–106)
CHOLESTEROL TOTAL: 145 mg/dL (ref 100–199)
CREATININE: 1.11 mg/dL (ref 0.76–1.27)
Chol/HDL Ratio: 3.6 ratio units (ref 0.0–5.0)
EOS (ABSOLUTE): 0.1 10*3/uL (ref 0.0–0.4)
ESTIMATED CHD RISK: 0.6 times avg. (ref 0.0–1.0)
Eos: 2 %
Free Thyroxine Index: 1.3 (ref 1.2–4.9)
GFR, EST AFRICAN AMERICAN: 88 mL/min/{1.73_m2} (ref >59)
GFR, EST NON AFRICAN AMERICAN: 76 mL/min/{1.73_m2} (ref >59)
GGT: 15 IU/L (ref 0–65)
Globulin, Total: 2.3 g/dL (ref 1.5–4.5)
Glucose: 101 mg/dL — ABNORMAL HIGH (ref 65–99)
HDL: 40 mg/dL (ref >39)
Hematocrit: 45.5 % (ref 37.5–51.0)
Hemoglobin: 15.5 g/dL (ref 13.0–17.7)
IRON: 112 ug/dL (ref 38–169)
Immature Grans (Abs): 0 10*3/uL (ref 0.0–0.1)
Immature Granulocytes: 0 %
LDH: 125 IU/L (ref 121–224)
LDL Calculated: 78 mg/dL (ref 0–99)
LYMPHS ABS: 1.7 10*3/uL (ref 0.7–3.1)
Lymphs: 37 %
MCH: 32.1 pg (ref 26.6–33.0)
MCHC: 34.1 g/dL (ref 31.5–35.7)
MCV: 94 fL (ref 79–97)
MONOCYTES: 7 %
Monocytes Absolute: 0.4 10*3/uL (ref 0.1–0.9)
Neutrophils Absolute: 2.6 10*3/uL (ref 1.4–7.0)
Neutrophils: 54 %
POTASSIUM: 4.5 mmol/L (ref 3.5–5.2)
Phosphorus: 2.9 mg/dL (ref 2.5–4.5)
Platelets: 173 10*3/uL (ref 150–379)
Prostate Specific Ag, Serum: 0.5 ng/mL (ref 0.0–4.0)
RBC: 4.83 x10E6/uL (ref 4.14–5.80)
RDW: 12.8 % (ref 12.3–15.4)
Sodium: 140 mmol/L (ref 134–144)
T3 Uptake Ratio: 28 % (ref 24–39)
T4 TOTAL: 4.7 ug/dL (ref 4.5–12.0)
TSH: 1.34 u[IU]/mL (ref 0.450–4.500)
Total Protein: 6.7 g/dL (ref 6.0–8.5)
Triglycerides: 135 mg/dL (ref 0–149)
Uric Acid: 5.7 mg/dL (ref 3.7–8.6)
VLDL Cholesterol Cal: 27 mg/dL (ref 5–40)
WBC: 4.8 10*3/uL (ref 3.4–10.8)

## 2016-01-27 LAB — B12 AND FOLATE PANEL
FOLATE: 9.3 ng/mL (ref >3.0)
VITAMIN B 12: 296 pg/mL (ref 232–1245)

## 2016-01-27 LAB — VITAMIN D 25 HYDROXY (VIT D DEFICIENCY, FRACTURES): Vit D, 25-Hydroxy: 32.2 ng/mL (ref 30.0–100.0)

## 2016-02-27 ENCOUNTER — Other Ambulatory Visit: Payer: Self-pay | Admitting: Emergency Medicine

## 2016-02-27 MED ORDER — LISINOPRIL 10 MG PO TABS: 10.0000 mg | ORAL_TABLET | Freq: Every day | ORAL | 5 refills | Status: DC

## 2016-02-27 NOTE — Telephone Encounter (Signed)
Med refill for lisinopril approved, pt had recent physical here in clinic

## 2016-04-04 ENCOUNTER — Other Ambulatory Visit: Payer: Self-pay | Admitting: Emergency Medicine

## 2016-04-04 MED ORDER — OMEPRAZOLE 20 MG PO CPDR: 20.0000 mg | DELAYED_RELEASE_CAPSULE | Freq: Every day | ORAL | 3 refills | Status: DC

## 2016-04-04 NOTE — Telephone Encounter (Signed)
Med refill for omeprazole approved 

## 2016-09-01 ENCOUNTER — Other Ambulatory Visit: Payer: Self-pay | Admitting: Physician Assistant

## 2016-09-02 NOTE — Telephone Encounter (Signed)
Med refill approved 

## 2016-09-20 ENCOUNTER — Ambulatory Visit: Payer: Self-pay | Admitting: Physician Assistant

## 2016-09-20 ENCOUNTER — Encounter: Payer: Self-pay | Admitting: Physician Assistant

## 2016-09-20 VITALS — BP 140/89 | HR 73 | Temp 98.5°F | Resp 16

## 2016-09-20 DIAGNOSIS — J309 Allergic rhinitis, unspecified: Secondary | ICD-10-CM

## 2016-09-20 MED ORDER — FLUTICASONE PROPIONATE 50 MCG/ACT NA SUSP: 2.0000 | Freq: Every day | NASAL | 6 refills | Status: DC

## 2016-09-20 MED ORDER — FEXOFENADINE HCL 180 MG PO TABS: 180.0000 mg | ORAL_TABLET | Freq: Every day | ORAL | 12 refills | Status: DC

## 2016-09-20 NOTE — Progress Notes (Signed)
S: C/o nasal congestion and pressure intermittently on r side  for 1-2 months, no fever, chills, cp/sob, v/d; denies mucus production, denies cough, no known injury  Using otc meds:   O: MV:HQIONGPE:vitals wnl, nad,  perrl eomi, normocephalic, tms dull, nasal mucosa boggy and swollen, throat injected, neck supple no lymph, lungs c t a, cv rrr, neuro intact  A:  Allergic sinusitis   P: flonase, allegra 180mg  qd, if not better in 2 weeks return to clinic

## 2016-11-28 ENCOUNTER — Ambulatory Visit: Payer: Self-pay | Admitting: Physician Assistant

## 2016-11-28 ENCOUNTER — Encounter: Payer: Self-pay | Admitting: Physician Assistant

## 2016-11-28 VITALS — BP 120/80 | HR 77 | Temp 97.8°F | Resp 16

## 2016-11-28 DIAGNOSIS — J0100 Acute maxillary sinusitis, unspecified: Secondary | ICD-10-CM

## 2016-11-28 MED ORDER — AZITHROMYCIN 250 MG PO TABS: ORAL_TABLET | ORAL | 0 refills | Status: DC

## 2016-11-28 MED ORDER — PREDNISONE 10 MG PO TABS: 30.0000 mg | ORAL_TABLET | Freq: Every day | ORAL | 0 refills | Status: DC

## 2016-11-28 NOTE — Progress Notes (Signed)
S: C/o runny nose and congestion for 3 days, no fever, chills, cp/sob, v/d; some ear pressure; mucus is green and thick, c/o of facial and dental pain.   Using otc meds:   O: PE: perrl eomi, normocephalic, tms dull, nasal mucosa red and swollen, throat injected, neck supple no lymph, lungs c t a, cv rrr, neuro intact  A:  Acute sinusitis   P: drink fluids, continue regular meds , use otc meds of choice, return if not improving in 5 days, return earlier if worsening . Zpack, pred  qd x 3d

## 2016-12-17 ENCOUNTER — Ambulatory Visit: Payer: Self-pay | Admitting: Physician Assistant

## 2016-12-17 ENCOUNTER — Encounter: Payer: Self-pay | Admitting: Physician Assistant

## 2016-12-17 VITALS — BP 143/88 | HR 72 | Temp 98.5°F | Resp 16

## 2016-12-17 DIAGNOSIS — R1011 Right upper quadrant pain: Secondary | ICD-10-CM

## 2016-12-17 NOTE — Progress Notes (Signed)
S: c/o ruq pain, worse after eating, no v/, does have intermittent diarrhea, ? If this could be from his gallbladder, no fever/chills, sx for a few days to weeks   O: vitals wnl, nad, lungs c t a, cv rrr, abd soft nontender bs normal all 4 quads  A: ruq pain  P: us of abd

## 2016-12-19 ENCOUNTER — Ambulatory Visit
Admission: RE | Admit: 2016-12-19 | Discharge: 2016-12-19 | Disposition: A | Discharge status: Home / Self Care | Payer: Managed Care, Other (non HMO) | Source: Ambulatory Visit | Attending: Physician Assistant | Admitting: Physician Assistant

## 2016-12-19 DIAGNOSIS — R1011 Right upper quadrant pain: Secondary | ICD-10-CM

## 2016-12-19 DIAGNOSIS — R932 Abnormal findings on diagnostic imaging of liver and biliary tract: Secondary | ICD-10-CM | POA: Diagnosis not present

## 2016-12-23 NOTE — Addendum Note (Signed)
Addended by: Catha BrowEACON, Maritta Kief T on: 12/23/2016 03:43 PM   Modules accepted: Orders

## 2017-01-06 ENCOUNTER — Encounter: Payer: Self-pay | Admitting: Gastroenterology

## 2017-01-06 ENCOUNTER — Encounter (INDEPENDENT_AMBULATORY_CARE_PROVIDER_SITE_OTHER): Payer: Self-pay

## 2017-01-06 ENCOUNTER — Ambulatory Visit: Payer: Managed Care, Other (non HMO) | Admitting: Gastroenterology

## 2017-01-06 VITALS — BP 160/93 | HR 61 | Temp 98.3°F | Ht 66.0 in | Wt 176.0 lb

## 2017-01-06 DIAGNOSIS — K3189 Other diseases of stomach and duodenum: Secondary | ICD-10-CM

## 2017-01-06 DIAGNOSIS — R1013 Epigastric pain: Secondary | ICD-10-CM | POA: Diagnosis not present

## 2017-01-06 DIAGNOSIS — K31A0 Gastric intestinal metaplasia, unspecified: Secondary | ICD-10-CM

## 2017-01-06 MED ORDER — OMEPRAZOLE 20 MG PO CPDR: 20.0000 mg | DELAYED_RELEASE_CAPSULE | Freq: Two times a day (BID) | ORAL | 2 refills | Status: DC

## 2017-01-06 NOTE — Patient Instructions (Signed)
Take Miralax over the counter once daily to maintain soft stool

## 2017-01-06 NOTE — Progress Notes (Signed)
Melodie BouillonVarnita Corryn Madewell, MD [address], [address], [place], [place], [id number] [address], [place], [place], [address] Phone: [phone]  Fax: [phone]  Consultation  Referring Provider:     Faythe GheeFisher, Susan W, PA-C Primary Care Physician:  Faythe GheeFisher, Susan W, PA-C Primary Gastroenterologist:  Pasty SpillersVarnita B Trygg Mantz, MD        Reason for Consultation:    Abdominal Pain  Date of Consultation:  01/06/2017         HPI:   Nathan Fox is a 51 y.o. male referred for abdominal pain (RUQ, intermittent, dull, 5/10, cramping, no radiation, for 1 month). No weight loss, N/V. Pt. Denies any blood in stool. Reports constipation with a BM every 2-3 days without blood. Previous records show that pt. Had complained of similar symptoms in 2016 when he was seen by Dr. Servando SnareWohl. Colonoscopy at the time was normal except for internal hemorrhoids, EGD showed mild gastritis showed focal intestinal metaplasia, reactive chemical gastritis, No H. Pylori. Random colon biopsy was normal. H. Pylori serology was negative. Abd U/S on Nov 1st showed a normal gallbladder, no gallstones, mildly increased hepatic echotexture most compatible with fatty liver. Pt. Was started on meloxicam by PCP as pain was thought to be musculoskeletal and has not helped him. He has been on once daily prilosec for years for acid reflux as without it his symptoms return.   Past Medical History:  Diagnosis Date  . GERD (gastroesophageal reflux disease)   . Hypertension   . PTSD (post-traumatic stress disorder)     Past Surgical History:  Procedure Laterality Date  . COLONOSCOPY WITH PROPOFOL N/A 09/23/2014   Performed by Midge MiniumWohl, Darren, [facility]  . ESOPHAGOGASTRODUODENOSCOPY (EGD) WITH PROPOFOL N/A 09/23/2014   Performed by Midge MiniumWohl, Darren, [facility]  . HERNIA REPAIR     x 2   . TONSILLECTOMY    . WRIST SURGERY Left    cyst    Prior to Admission medications   Medication Sig Start Date End Date  Taking? Authorizing Provider  lisinopril (PRINIVIL,ZESTRIL) 10 MG tablet TAKE 1 TABLET BY MOUTH EVERY DAY 09/02/16   Sherrie MustacheFisher, Roselyn BeringSusan W, PA-C  omeprazole (PRILOSEC) 20 MG capsule Take 1 capsule (20 mg total) 2 (two) times daily before a meal by mouth. 01/06/17 02/05/17  Pasty Spillersahiliani, Emilene Roma B, MD    Family History  Problem Relation Age of Onset  . Hypertension Mother   . Stroke Mother   . Arthritis Mother   . Asthma Mother   . Asthma Maternal Grandmother      Social History   Tobacco Use  . Smoking status: Never Smoker  . Smokeless tobacco: Current User    Types: Snuff  Substance Use Topics  . Alcohol use: Yes    Alcohol/week: 0.0 oz    Comment: occas.  . Drug use: No    Allergies as of 01/06/2017  . (No Known Allergies)    Review of Systems:    All systems reviewed and negative except where noted in HPI.   Physical Exam:  Vital signs in last 24 hours: Vitals:   01/06/17 1327  BP: (!) 160/93  Pulse: 61  Temp: 98.3 F (36.8 C)  TempSrc: Oral  Weight: 79.8 kg (176 lb)  Height: 5\' 6"  (1.676 m)     General:   Pleasant, cooperative in NAD Head:  Normocephalic and atraumatic. Eyes:   No icterus.   Conjunctiva pink. PERRLA. Ears:  Normal auditory acuity. Neck:  Supple; no masses or thyroidomegaly Lungs: Respirations even and unlabored. Lungs clear to auscultation bilaterally.   No wheezes, crackles, or rhonchi.  Heart:  Regular rate and rhythm;  Without murmur, clicks, rubs or gallops Abdomen:  Soft, nondistended, nontender. Normal bowel sounds. No appreciable masses or hepatomegaly.  No rebound or guarding.  Neurologic:  Alert and oriented x3;  grossly normal neurologically. Skin:  Intact without significant lesions or rashes. Cervical Nodes:  No significant cervical adenopathy. Psych:  Alert and cooperative. Normal affect.  LAB RESULTS: No results for input(s): WBC, HGB, HCT, PLT in the last 72 hours. BMET No results for input(s): NA, K, CL, CO2, GLUCOSE, BUN,  CREATININE, CALCIUM in the last 72 hours. LFT No results for input(s): PROT, ALBUMIN, AST, ALT, ALKPHOS, BILITOT, BILIDIR, IBILI in the last 72 hours. PT/INR No results for input(s): LABPROT, INR in the last 72 hours.  STUDIES: No results found.  Abd U/S report reviewed  Impression / Plan:   Nathan Fox is a 51 y.o. y/o male with chronic RUQ pain with recent exacerbation with no alarm symptoms present  Dyspepsia vs. GERD vs. Constipation induced  Will check baseline labs as none available and check liver enzymes. Symptoms not consistent with biliary colic, U/S negative  Will also obtain H Pylori serology as pt. Does not want to be off PPI for stool testing. His last serology was negative in 2016 and if this one is positive it would indicate infection since last testing  Will increase prilosec to BID as symptoms maybe related to gerd. Antireflux measures. Dose can be decreased once symptoms improve. Risks of PPI use were discussed with patient including bone loss, C. Diff diarrhea, pneumonia, infections, CKD, electrolyte abnormalities. Pt. Verbalizes understanding and chooses to continue the medication.   Start miralax daily with goal of 1-2 soft BM a day  Monitor for symptom improvement  Gastric intestinal metaplasia history noted, can discuss this with patient on next visit  Thank you for involving me in the care of this patient.    Pasty SpillersVarnita B Dalessandro Baldyga, MD  01/06/2017, 7:56 PM

## 2017-01-13 ENCOUNTER — Ambulatory Visit: Payer: Self-pay | Admitting: Physician Assistant

## 2017-01-13 ENCOUNTER — Encounter: Payer: Self-pay | Admitting: Physician Assistant

## 2017-01-13 VITALS — BP 145/67 | HR 64 | Temp 98.5°F | Resp 16 | Ht 66.0 in | Wt 171.0 lb

## 2017-01-13 DIAGNOSIS — Z0000 Encounter for general adult medical examination without abnormal findings: Secondary | ICD-10-CM

## 2017-01-13 NOTE — Addendum Note (Signed)
Addended by: Catha BrowEACON, MONIQUE T on: 01/13/2017 02:34 PM   Modules accepted: Orders

## 2017-01-13 NOTE — Progress Notes (Signed)
   Subjective: Physical exam     Patient ID: Wynona DoveJohnny L Searson, male    DOB: 1965-10-30, 51 y.o.   MRN: [id number]  HPI Patient presented annual exam complaining of intermittent right upper quadrant pain. Patient had an ultrasound which is unremarkable.   Review of Systems  Right upper quadrant    Objective:   Physical Exam HEENT unremarkable. Neck supple without adenopathy. Lungs CTA heart regular rate and rhythm. Abdomen with negative HSM, normoactive bowel sounds, and soft nontender palpation. No obvious cervical or lumbar deformity. Patient equal range of motion of the cervical lumbar spine. Patient is no obvious upper or lower extremity deformity. Patient for nuchal range of motion of the upper and lower extremities. Strength is 5 over 5. Cranial nerves II through XII grossly intact.        Assessment & Plan: Well exam   Patient advised extra labs were drawn to evaluate for elevated lipase and amylase. Patient will contact 11 results are available.

## 2017-01-14 LAB — CMP12+LP+TP+TSH+6AC+PSA+CBC…
A/G RATIO: 1.9 (ref 1.2–2.2)
ALK PHOS: 71 IU/L (ref 39–117)
ALT: 26 IU/L (ref 0–44)
AST: 21 IU/L (ref 0–40)
Albumin: 4.6 g/dL (ref 3.5–5.5)
BASOS ABS: 0 10*3/uL (ref 0.0–0.2)
BILIRUBIN TOTAL: 0.5 mg/dL (ref 0.0–1.2)
BUN / CREAT RATIO: 11 (ref 9–20)
BUN: 12 mg/dL (ref 6–24)
Basos: 1 %
CALCIUM: 9.3 mg/dL (ref 8.7–10.2)
CHLORIDE: 103 mmol/L (ref 96–106)
CHOLESTEROL TOTAL: 126 mg/dL (ref 100–199)
Chol/HDL Ratio: 3.3 ratio (ref 0.0–5.0)
Creatinine, Ser: 1.14 mg/dL (ref 0.76–1.27)
EOS (ABSOLUTE): 0.1 10*3/uL (ref 0.0–0.4)
EOS: 2 %
Estimated CHD Risk: <0.5 times avg. (ref 0.0–1.0)
FREE THYROXINE INDEX: 1.5 (ref 1.2–4.9)
GFR calc Af Amer: 85 mL/min/{1.73_m2} (ref >59)
GFR calc non Af Amer: 74 mL/min/{1.73_m2} (ref >59)
GGT: 17 IU/L (ref 0–65)
GLUCOSE: 92 mg/dL (ref 65–99)
Globulin, Total: 2.4 g/dL (ref 1.5–4.5)
HDL: 38 mg/dL — AB (ref >39)
HEMOGLOBIN: 15.5 g/dL (ref 13.0–17.7)
Hematocrit: 46 % (ref 37.5–51.0)
IMMATURE GRANULOCYTES: 0 %
Immature Grans (Abs): 0 10*3/uL (ref 0.0–0.1)
Iron: 184 ug/dL — ABNORMAL HIGH (ref 38–169)
LDH: 137 IU/L (ref 121–224)
LDL CALC: 55 mg/dL (ref 0–99)
LYMPHS ABS: 1.7 10*3/uL (ref 0.7–3.1)
Lymphs: 43 %
MCH: 32.2 pg (ref 26.6–33.0)
MCHC: 33.7 g/dL (ref 31.5–35.7)
MCV: 96 fL (ref 79–97)
MONOCYTES: 8 %
Monocytes Absolute: 0.3 10*3/uL (ref 0.1–0.9)
NEUTROS PCT: 46 %
Neutrophils Absolute: 1.9 10*3/uL (ref 1.4–7.0)
PHOSPHORUS: 3 mg/dL (ref 2.5–4.5)
PLATELETS: 188 10*3/uL (ref 150–379)
PROSTATE SPECIFIC AG, SERUM: 0.5 ng/mL (ref 0.0–4.0)
Potassium: 3.9 mmol/L (ref 3.5–5.2)
RBC: 4.81 x10E6/uL (ref 4.14–5.80)
RDW: 12.6 % (ref 12.3–15.4)
Sodium: 144 mmol/L (ref 134–144)
T3 Uptake Ratio: 28 % (ref 24–39)
T4, Total: 5.5 ug/dL (ref 4.5–12.0)
TOTAL PROTEIN: 7 g/dL (ref 6.0–8.5)
TRIGLYCERIDES: 167 mg/dL — AB (ref 0–149)
TSH: 1.33 u[IU]/mL (ref 0.450–4.500)
Uric Acid: 6.9 mg/dL (ref 3.7–8.6)
VLDL CHOLESTEROL CAL: 33 mg/dL (ref 5–40)
WBC: 4 10*3/uL (ref 3.4–10.8)

## 2017-01-14 LAB — VITAMIN D 25 HYDROXY (VIT D DEFICIENCY, FRACTURES): VIT D 25 HYDROXY: 34.4 ng/mL (ref 30.0–100.0)

## 2017-01-14 LAB — HEPATITIS C ANTIBODY (REFLEX): HCV Ab: <0.1 s/co ratio (ref 0.0–0.9)

## 2017-01-14 LAB — HCV COMMENT:

## 2017-01-14 LAB — H. PYLORI ANTIBODY, IGG: H. pylori, IgG AbS: <0.8 Index Value (ref 0.00–0.79)

## 2017-01-14 LAB — HIV ANTIBODY (ROUTINE TESTING W REFLEX): HIV Screen 4th Generation wRfx: NONREACTIVE

## 2017-01-15 ENCOUNTER — Telehealth: Payer: Self-pay

## 2017-01-15 NOTE — Telephone Encounter (Signed)
LVM for patient callback for results per Dr. Maximino Greenlandahiliani:    - Please let patient know his testing for H Pylori bacteria were negative. His liver tests and blood counts are normal. He should follow up with his PCP about the rest of the tests that they performed.

## 2017-01-22 ENCOUNTER — Ambulatory Visit: Payer: Managed Care, Other (non HMO) | Admitting: Gastroenterology

## 2017-02-24 ENCOUNTER — Other Ambulatory Visit: Payer: Self-pay | Admitting: Emergency Medicine

## 2017-02-24 MED ORDER — LISINOPRIL 10 MG PO TABS: 10.0000 mg | ORAL_TABLET | Freq: Every day | ORAL | 5 refills | Status: DC

## 2017-03-04 ENCOUNTER — Telehealth: Payer: Self-pay | Admitting: Gastroenterology

## 2017-03-04 ENCOUNTER — Other Ambulatory Visit: Payer: Self-pay

## 2017-03-04 NOTE — Progress Notes (Signed)
Advised patient as instructed by Dr. Maximino Greenlandahiliani.  Can you call him and ask him  1. Is he taking miralax daily.:  NO 2. Is he having 1-2 soft BM daily. If not, increase the miralax to 2 doses in the morning daily.: ADVISED PATIENT TO TAKE MIRALAX DAILY 3. Is he taking his PPI twice a day like we prescribed? TAKING PPI.f  Pt states that the pain has moved lower. It has become a sharper pain with a slight burning sensation.

## 2017-03-04 NOTE — Telephone Encounter (Signed)
Patient LVM and still has abdominal pain (0-10 at a 5) starts in the evening. What can he do? Please call.

## 2017-03-04 NOTE — Telephone Encounter (Signed)
Forwarding to Dr Maximino Greenlandahiliani- seen at the office

## 2017-03-05 NOTE — Telephone Encounter (Signed)
Pt states this pain starts after lunch, (no problems after breakfast) under rib cage but after he gets home and lies down it is better. Advised pt to take note of what he eats at lunch. He may want to avoid fried or spicy foods. To contact office if no improvement after taking Miralax (give this about 7 days). If pain no better contact office for appt or if worsens to go to ED.

## 2017-04-22 ENCOUNTER — Other Ambulatory Visit: Payer: Self-pay

## 2017-04-22 ENCOUNTER — Encounter: Payer: Self-pay | Admitting: Gastroenterology

## 2017-04-22 ENCOUNTER — Ambulatory Visit: Payer: Managed Care, Other (non HMO) | Admitting: Gastroenterology

## 2017-04-22 VITALS — BP 152/90 | HR 74 | Ht 65.0 in | Wt 173.4 lb

## 2017-04-22 DIAGNOSIS — K3189 Other diseases of stomach and duodenum: Secondary | ICD-10-CM

## 2017-04-22 DIAGNOSIS — K5909 Other constipation: Secondary | ICD-10-CM

## 2017-04-22 DIAGNOSIS — K31A0 Gastric intestinal metaplasia, unspecified: Secondary | ICD-10-CM

## 2017-04-22 NOTE — Patient Instructions (Signed)
F/u 6 months Miralax daily High-Fiber Diet Fiber, also called dietary fiber, is a type of carbohydrate found in fruits, vegetables, whole grains, and beans. A high-fiber diet can have many health benefits. Your health care provider may recommend a high-fiber diet to help:  Prevent constipation. Fiber can make your bowel movements more regular.  Lower your cholesterol.  Relieve hemorrhoids, uncomplicated diverticulosis, or irritable bowel syndrome.  Prevent overeating as part of a weight-loss plan.  Prevent heart disease, type 2 diabetes, and certain cancers.  What is my plan? The recommended daily intake of fiber includes:  38 grams for men under age 53.  30 grams for men over age 53.  25 grams for women under age 53.  21 grams for women over age 53.  You can get the recommended daily intake of dietary fiber by eating a variety of fruits, vegetables, grains, and beans. Your health care provider may also recommend a fiber supplement if it is not possible to get enough fiber through your diet. What do I need to know about a high-fiber diet?  Fiber supplements have not been widely studied for their effectiveness, so it is better to get fiber through food sources.  Always check the fiber content on thenutrition facts label of any prepackaged food. Look for foods that contain at least 5 grams of fiber per serving.  Ask your dietitian if you have questions about specific foods that are related to your condition, especially if those foods are not listed in the following section.  Increase your daily fiber consumption gradually. Increasing your intake of dietary fiber too quickly may cause bloating, cramping, or gas.  Drink plenty of water. Water helps you to digest fiber. What foods can I eat? Grains Whole-grain breads. Multigrain cereal. Oats and oatmeal. Brown rice. Barley. Bulgur wheat. Millet. Bran muffins. Popcorn. Rye wafer crackers. Vegetables Sweet potatoes. Spinach. Kale.  Artichokes. Cabbage. Broccoli. Green peas. Carrots. Squash. Fruits Berries. Pears. Apples. Oranges. Avocados. Prunes and raisins. Dried figs. Meats and Other Protein Sources Navy, kidney, pinto, and soy beans. Split peas. Lentils. Nuts and seeds. Dairy Fiber-fortified yogurt. Beverages Fiber-fortified soy milk. Fiber-fortified orange juice. Other Fiber bars. The items listed above may not be a complete list of recommended foods or beverages. Contact your dietitian for more options. What foods are not recommended? Grains White bread. Pasta made with refined flour. White rice. Vegetables Fried potatoes. Canned vegetables. Well-cooked vegetables. Fruits Fruit juice. Cooked, strained fruit. Meats and Other Protein Sources Fatty cuts of meat. Fried Environmental education officerpoultry or fried fish. Dairy Milk. Yogurt. Cream cheese. Sour cream. Beverages Soft drinks. Other Cakes and pastries. Butter and oils. The items listed above may not be a complete list of foods and beverages to avoid. Contact your dietitian for more information. What are some tips for including high-fiber foods in my diet?  Eat a wide variety of high-fiber foods.  Make sure that half of all grains consumed each day are whole grains.  Replace breads and cereals made from refined flour or white flour with whole-grain breads and cereals.  Replace white rice with brown rice, bulgur wheat, or millet.  Start the day with a breakfast that is high in fiber, such as a cereal that contains at least 5 grams of fiber per serving.  Use beans in place of meat in soups, salads, or pasta.  Eat high-fiber snacks, such as berries, raw vegetables, nuts, or popcorn. This information is not intended to replace advice given to you by your health care provider.  Make sure you discuss any questions you have with your health care provider. Document Released: 02/04/2005 Document Revised: 07/13/2015 Document Reviewed: 07/20/2013 Elsevier Interactive Patient  Education  [employer].

## 2017-04-22 NOTE — Progress Notes (Signed)
[name], MD [address]  [address]  [place], [place] [address]  Main: [phone]  Fax: [phone]   Primary Care Physician: [name], PA-C  Primary Gastroenterologist:  Dr. [name]  Chief Complaint  Patient presents with  . Follow-up    GERD, CONSTIPATION    HPI: Nathan Fox is a 52 y.o. male here for follow-up of right upper quadrant abdominal pain.  Initially seen on January 06, 2017 initial consultation, at which time pain had been ongoing intermittently for 1 month.  Patient was told to start taking Prilosec twice a day after his initial visit.  He states pain has improved since then.  However, is still reporting right upper quadrant cramping, intermittent abdominal pain after meals, occurs 30 minutes after meals.  With no radiation.  No nausea vomiting.  No dysphagia.  No heartburn.  "Previous records show that pt. Had complained of similar symptoms in 2016 when he was seen by Dr. Servando Snare. Colonoscopy at the time was normal except for internal hemorrhoids, EGD showed mild gastritis showed focal intestinal metaplasia, reactive chemical gastritis, No H. Pylori. Random colon biopsy was normal. H. Pylori serology was negative. Abd U/S on Nov 1st showed a normal gallbladder, no gallstones, mildly increased hepatic echotexture most compatible with fatty liver.  "Previous records show that pt. Had complained of similar symptoms in 2016 when he was seen by Dr. Servando Snare. Colonoscopy at the time was normal except for internal hemorrhoids, EGD showed mild gastritis showed focal intestinal metaplasia, reactive chemical gastritis, No H. Pylori. Random colon biopsy was normal. H. Pylori serology was negative. Abd U/S on Nov 1st showed a normal gallbladder, no gallstones, mildly increased hepatic echotexture most compatible with fatty liver."  Last colonoscopy was in 2016, internal hemorrhoids, normal exam, excellent prep and repeat recommended in 10 years.   Random colon biopsies at the time were normal.  Current Outpatient Medications  Medication Sig Dispense Refill  . lisinopril (PRINIVIL,ZESTRIL) 10 MG tablet Take 1 tablet (10 mg total) by mouth daily. 30 tablet 5  . omeprazole (PRILOSEC) 20 MG capsule Take 1 capsule (20 mg total) 2 (two) times daily before a meal by mouth. 60 capsule 2  . meloxicam (MOBIC) 15 MG tablet Take 15 mg by mouth daily as needed.  2   No current facility-administered medications for this visit.     Allergies as of 04/22/2017  . (No Known Allergies)    ROS:  General: Negative for anorexia, weight loss, fever, chills, fatigue, weakness. ENT: Negative for hoarseness, difficulty swallowing , nasal congestion. CV: Negative for chest pain, angina, palpitations, dyspnea on exertion, peripheral edema.  Respiratory: Negative for dyspnea at rest, dyspnea on exertion, cough, sputum, wheezing.  GI: See history of present illness. GU:  Negative for dysuria, hematuria, urinary incontinence, urinary frequency, nocturnal urination.  Endo: Negative for unusual weight change.    Physical Examination:   BP (!) 152/90   Pulse 74   Ht 5\' 5"  (1.651 m)   Wt 173 lb 6.4 oz (78.7 kg)   BMI 28.86 kg/m   General: Well-nourished, well-developed in no acute distress.  Eyes: No icterus. Conjunctivae pink. Mouth: Oropharyngeal mucosa moist and pink , no lesions erythema or exudate. Neck: Supple, Trachea midline Abdomen: Bowel sounds are normal, nontender, nondistended, no hepatosplenomegaly or masses, no abdominal bruits or hernia , no rebound or guarding.   Extremities: No lower extremity edema. No clubbing or deformities. Neuro: Alert and oriented x 3.  Grossly intact. Skin:  Warm and dry, no jaundice.   Psych: Alert and cooperative, normal mood and affect.   Labs: CMP     Component Value Date/Time   NA 144 01/13/2017 0904   K 3.9 01/13/2017 0904   CL 103 01/13/2017 0904   GLUCOSE 92 01/13/2017 0904   BUN 12 01/13/2017  0904   CREATININE 1.14 01/13/2017 0904   CALCIUM 9.3 01/13/2017 0904   PROT 7.0 01/13/2017 0904   ALBUMIN 4.6 01/13/2017 0904   AST 21 01/13/2017 0904   ALT 26 01/13/2017 0904   ALKPHOS 71 01/13/2017 0904   BILITOT 0.5 01/13/2017 0904   GFRNONAA 74 01/13/2017 0904   GFRAA 85 01/13/2017 0904   Lab Results  Component Value Date   WBC 4.0 01/13/2017   HGB 15.5 01/13/2017   HCT 46.0 01/13/2017   MCV 96 01/13/2017   PLT 188 01/13/2017    Imaging Studies: No results found.  Assessment and Plan:   Wynona DoveJohnny L Turvey is a 52 y.o. y/o male with intermittent right upper quadrant abdominal pain, since October 2018 with improvement in symptoms with PPI twice daily  Previous EGD showed intestinal metaplasia on gastric biopsies in 2016 Patient needs repeat EGD for gastric mapping biopsies We will also evaluate for any underlying ulcers that are causing his abdominal pain Can decrease PPI after EGD, if no ulcers or other indications for twice daily PPI present during the endoscopy I have discussed alternative options, risks & benefits,  which include, but are not limited to, bleeding, infection, perforation,respiratory complication & drug reaction.  The patient agrees with this plan & written consent will be obtained.    Patient not taking MiraLAX as discussed on last visit, he took it for a week and during that week his symptoms were better as far as abdominal pain His abdominal pain may be related to constipation He was  asked to start taking MiraLAX every day or every other day and maintain 1-2 soft problems daily High-fiber diet encouraged again  Fatty liver seen on ultrasound,  weight loss, diet and exercise encouraged Avoid hepatotoxic drugs Only uses alcohol occasionally, encouraged to continue to limit alcohol intake  Last colonoscopy was in 2016, internal hemorrhoids, normal exam, excellent prep and repeat recommended in 10 years.  Random colon biopsies at the time were  normal.  Dr Melodie BouillonVarnita Katerin Negrete

## 2017-04-23 ENCOUNTER — Other Ambulatory Visit: Payer: Self-pay

## 2017-04-23 DIAGNOSIS — R109 Unspecified abdominal pain: Secondary | ICD-10-CM

## 2017-04-23 DIAGNOSIS — K3189 Other diseases of stomach and duodenum: Secondary | ICD-10-CM

## 2017-04-23 DIAGNOSIS — R12 Heartburn: Secondary | ICD-10-CM

## 2017-04-23 DIAGNOSIS — K31A0 Gastric intestinal metaplasia, unspecified: Secondary | ICD-10-CM

## 2017-04-25 ENCOUNTER — Telehealth: Payer: Self-pay | Admitting: Gastroenterology

## 2017-04-25 NOTE — Telephone Encounter (Signed)
Please call patient to reschedule procedure. His wife will be out of town.

## 2017-04-25 NOTE — Telephone Encounter (Signed)
Pt talked with someone from office and this was scheduled.

## 2017-05-07 ENCOUNTER — Ambulatory Visit: Payer: Managed Care, Other (non HMO) | Admitting: Anesthesiology

## 2017-05-07 ENCOUNTER — Ambulatory Visit
Admission: RE | Admit: 2017-05-07 | Discharge: 2017-05-07 | Disposition: A | Discharge status: Home / Self Care | Payer: Managed Care, Other (non HMO) | Source: Ambulatory Visit | Attending: Gastroenterology | Admitting: Gastroenterology

## 2017-05-07 ENCOUNTER — Encounter
Admission: RE | Disposition: A | Discharge status: Home / Self Care | Payer: Self-pay | Source: Ambulatory Visit | Attending: Gastroenterology

## 2017-05-07 DIAGNOSIS — Z79899 Other long term (current) drug therapy: Secondary | ICD-10-CM | POA: Insufficient documentation

## 2017-05-07 DIAGNOSIS — Z8249 Family history of ischemic heart disease and other diseases of the circulatory system: Secondary | ICD-10-CM | POA: Diagnosis not present

## 2017-05-07 DIAGNOSIS — K219 Gastro-esophageal reflux disease without esophagitis: Secondary | ICD-10-CM | POA: Insufficient documentation

## 2017-05-07 DIAGNOSIS — K2289 Other specified disease of esophagus: Secondary | ICD-10-CM

## 2017-05-07 DIAGNOSIS — R1011 Right upper quadrant pain: Secondary | ICD-10-CM | POA: Insufficient documentation

## 2017-05-07 DIAGNOSIS — K3189 Other diseases of stomach and duodenum: Secondary | ICD-10-CM | POA: Diagnosis not present

## 2017-05-07 DIAGNOSIS — Z823 Family history of stroke: Secondary | ICD-10-CM | POA: Diagnosis not present

## 2017-05-07 DIAGNOSIS — Z825 Family history of asthma and other chronic lower respiratory diseases: Secondary | ICD-10-CM | POA: Insufficient documentation

## 2017-05-07 DIAGNOSIS — K317 Polyp of stomach and duodenum: Secondary | ICD-10-CM | POA: Insufficient documentation

## 2017-05-07 DIAGNOSIS — R12 Heartburn: Secondary | ICD-10-CM

## 2017-05-07 DIAGNOSIS — Z87891 Personal history of nicotine dependence: Secondary | ICD-10-CM | POA: Insufficient documentation

## 2017-05-07 DIAGNOSIS — I10 Essential (primary) hypertension: Secondary | ICD-10-CM | POA: Insufficient documentation

## 2017-05-07 DIAGNOSIS — F4310 Post-traumatic stress disorder, unspecified: Secondary | ICD-10-CM | POA: Diagnosis not present

## 2017-05-07 DIAGNOSIS — F419 Anxiety disorder, unspecified: Secondary | ICD-10-CM | POA: Diagnosis not present

## 2017-05-07 DIAGNOSIS — K31A0 Gastric intestinal metaplasia, unspecified: Secondary | ICD-10-CM

## 2017-05-07 DIAGNOSIS — K228 Other specified diseases of esophagus: Secondary | ICD-10-CM

## 2017-05-07 DIAGNOSIS — R109 Unspecified abdominal pain: Secondary | ICD-10-CM

## 2017-05-07 DIAGNOSIS — L83 Acanthosis nigricans: Secondary | ICD-10-CM | POA: Diagnosis not present

## 2017-05-07 HISTORY — PX: ESOPHAGOGASTRODUODENOSCOPY (EGD) WITH PROPOFOL: SHX5813

## 2017-05-07 SURGERY — ESOPHAGOGASTRODUODENOSCOPY (EGD) WITH PROPOFOL
Anesthesia: General

## 2017-05-07 MED ORDER — FENTANYL CITRATE (PF) 100 MCG/2ML IJ SOLN
INTRAMUSCULAR | Status: DC | PRN
  Administered 2017-05-07: 100 ug via INTRAVENOUS

## 2017-05-07 MED ORDER — PROPOFOL 500 MG/50ML IV EMUL
INTRAVENOUS | Status: AC
  Filled 2017-05-07: qty 50

## 2017-05-07 MED ORDER — LIDOCAINE HCL (PF) 1 % IJ SOLN
INTRAMUSCULAR | Status: AC
  Administered 2017-05-07: 0.3 mL via INTRADERMAL
  Filled 2017-05-07: qty 2

## 2017-05-07 MED ORDER — PROPOFOL 10 MG/ML IV BOLUS
INTRAVENOUS | Status: AC
  Filled 2017-05-07: qty 20

## 2017-05-07 MED ORDER — SODIUM CHLORIDE 0.9 % IV SOLN
INTRAVENOUS | Status: DC
  Administered 2017-05-07: 1000 mL via INTRAVENOUS

## 2017-05-07 MED ORDER — FENTANYL CITRATE (PF) 100 MCG/2ML IJ SOLN
INTRAMUSCULAR | Status: AC
  Filled 2017-05-07: qty 2

## 2017-05-07 MED ORDER — LIDOCAINE HCL (PF) 1 % IJ SOLN
2.0000 mL | Freq: Once | INTRAMUSCULAR | Status: AC
  Administered 2017-05-07: 0.3 mL via INTRADERMAL

## 2017-05-07 MED ORDER — PROPOFOL 10 MG/ML IV BOLUS
INTRAVENOUS | Status: DC | PRN
  Administered 2017-05-07: 100 mg via INTRAVENOUS

## 2017-05-07 MED ORDER — LIDOCAINE HCL (PF) 2 % IJ SOLN
INTRAMUSCULAR | Status: AC
  Filled 2017-05-07: qty 10

## 2017-05-07 MED ORDER — PROPOFOL 500 MG/50ML IV EMUL
INTRAVENOUS | Status: DC | PRN
  Administered 2017-05-07: 160 ug/kg/min via INTRAVENOUS

## 2017-05-07 MED ORDER — LIDOCAINE 2% (20 MG/ML) 5 ML SYRINGE
INTRAMUSCULAR | Status: DC | PRN
  Administered 2017-05-07: 30 mg via INTRAVENOUS

## 2017-05-07 MED ORDER — SODIUM CHLORIDE 0.9 % IV SOLN
INTRAVENOUS | Status: DC | PRN
  Administered 2017-05-07: 09:00:00 via INTRAVENOUS

## 2017-05-07 MED ORDER — OMEPRAZOLE 20 MG PO CPDR: 20.0000 mg | DELAYED_RELEASE_CAPSULE | Freq: Every day | ORAL | 1 refills | Status: DC

## 2017-05-07 NOTE — Transfer of Care (Signed)
Immediate Anesthesia Transfer of Care Note  Patient: Nathan DoveJohnny L Dunavan  Procedure(s) Performed: ESOPHAGOGASTRODUODENOSCOPY (EGD) WITH PROPOFOL (N/A )  Patient Location: PACU and Endoscopy Unit  Anesthesia Type:General  Level of Consciousness: sedated  Airway & Oxygen Therapy: Patient Spontanous Breathing and Patient connected to nasal cannula oxygen  Post-op Assessment: Report given to RN and Post -op Vital signs reviewed and stable  Post vital signs: Reviewed and stable  Last Vitals:  Vitals:   05/07/17 0848  BP: (!) 140/92  Pulse: (!) 46  Resp: 17  Temp: (!) 35 C  SpO2: 100%    Last Pain: There were no vitals filed for this visit.       Complications: No apparent anesthesia complications

## 2017-05-07 NOTE — Anesthesia Preprocedure Evaluation (Signed)
Anesthesia Evaluation  Patient identified by MRN, date of birth, ID band Patient awake    Reviewed: Allergy & Precautions, NPO status , Patient's Chart, lab work & pertinent test results  History of Anesthesia Complications Negative for: history of anesthetic complications  Airway Mallampati: I  TM Distance: >3 FB Neck ROM: Full    Dental no notable dental hx.    Pulmonary neg pulmonary ROS, neg sleep apnea, neg COPD,    breath sounds clear to auscultation- rhonchi (-) wheezing      Cardiovascular Exercise Tolerance: Good hypertension, Pt. on medications (-) CAD, (-) Past MI, (-) Cardiac Stents and (-) CABG  Rhythm:Regular Rate:Normal - Systolic murmurs and - Diastolic murmurs    Neuro/Psych PSYCHIATRIC DISORDERS Anxiety negative neurological ROS     GI/Hepatic Neg liver ROS, GERD  ,  Endo/Other  negative endocrine ROSneg diabetes  Renal/GU negative Renal ROS     Musculoskeletal negative musculoskeletal ROS (+)   Abdominal (+) - obese,   Peds  Hematology negative hematology ROS (+)   Anesthesia Other Findings Past Medical History: No date: GERD (gastroesophageal reflux disease) No date: Hypertension No date: PTSD (post-traumatic stress disorder)   Reproductive/Obstetrics                             Anesthesia Physical Anesthesia Plan  ASA: II  Anesthesia Plan: General   Post-op Pain Management:    Induction: Intravenous  PONV Risk Score and Plan: 1 and Propofol infusion  Airway Management Planned: Natural Airway  Additional Equipment:   Intra-op Plan:   Post-operative Plan:   Informed Consent: I have reviewed the patients History and Physical, chart, labs and discussed the procedure including the risks, benefits and alternatives for the proposed anesthesia with the patient or authorized representative who has indicated his/her understanding and acceptance.   Dental  advisory given  Plan Discussed with: CRNA and Anesthesiologist  Anesthesia Plan Comments:         Anesthesia Quick Evaluation

## 2017-05-07 NOTE — Anesthesia Postprocedure Evaluation (Signed)
Anesthesia Post Note  Patient: Wynona DoveJohnny L Kotara  Procedure(s) Performed: ESOPHAGOGASTRODUODENOSCOPY (EGD) WITH PROPOFOL (N/A )  Patient location during evaluation: Endoscopy Anesthesia Type: General Level of consciousness: awake and alert and oriented Pain management: pain level controlled Vital Signs Assessment: post-procedure vital signs reviewed and stable Respiratory status: spontaneous breathing, nonlabored ventilation and respiratory function stable Cardiovascular status: blood pressure returned to baseline and stable Postop Assessment: no signs of nausea or vomiting Anesthetic complications: no     Last Vitals:  Vitals:   05/07/17 0943 05/07/17 0952  BP: (!) 98/56 103/63  Pulse: 66 (!) 57  Resp: 14 15  Temp: (!) 36.1 C   SpO2: 99% 99%    Last Pain:  Vitals:   05/07/17 0942  TempSrc: Tympanic                 Eydan Chianese

## 2017-05-07 NOTE — Anesthesia Post-op Follow-up Note (Signed)
Anesthesia QCDR form completed.        

## 2017-05-07 NOTE — Op Note (Signed)
[facility] Gastroenter[facility] Patient Name: Nathan LatinoJohnny Fox Procedure Date: 05/07/2017 9:07 AM MRN: [id number] Account #: [id number] Date of Birth: 03-27-1965 Admit Type: Outpatient Age: [age] [facility] Gender: Male Note Status: Finalized Procedure:            Upper GI endoscopy Indications:          Abdominal pain in the right upper quadrant, Intestinal                        metaplasia, Follow-up of intestinal metaplasia Providers:            Varnita B. Maximino Greenlandahiliani MD, MD Referring MD:         Faythe GheeSusan W. Fisher, MD (Referring MD) Medicines:            Monitored Anesthesia Care Complications:        No immediate complications. Procedure:            Pre-Anesthesia Assessment:                       - Prior to the procedure, a History and Physical was                        performed, and patient medications, allergies and                        sensitivities were reviewed. The patient's tolerance of                        previous anesthesia was reviewed.                       - The risks and benefits of the procedure and the                        sedation options and risks were discussed with the                        patient. All questions were answered and informed                        consent was obtained.                       - Patient identification and proposed procedure were                        verified prior to the procedure by the physician, the                        nurse, the anesthesiologist, the anesthetist and the                        technician. The procedure was verified in the procedure                        room.                       - ASA Grade Assessment: II - A patient with mild  systemic disease.                       After obtaining informed consent, the endoscope was                        passed under direct vision. Throughout the procedure,                        the patient's blood pressure, pulse,  and oxygen                        saturations were monitored continuously. The Endoscope                        was introduced through the mouth, and advanced to the                        third part of duodenum. The upper GI endoscopy was                        accomplished with ease. The patient tolerated the                        procedure well. Findings:      The Z-line was irregular and was found 38 cm from the incisors.      One faint small tongue of salmon-colored mucosa was present at the GE       junction. No other visible abnormalities were present. The maximum       longitudinal extent of these esophageal mucosal changes was 0.5 cm in       length. Mucosa was biopsied with a cold forceps for histology in 4       quadrants at the gastroesophageal junction. One specimen bottle was sent       to pathology.      Localized mild mucosal changes characterized by Salmon colored mucosa at       25 cm (likely inlet patch) at 25 cm were found in the mid esophagus.       Biopsies were taken with a cold forceps for histology.      Patchy mildly erythematous mucosa without bleeding was found in the       gastric antrum. Biopsies were taken with a cold forceps for histology.       Biopsies were obtained on the greater curvature of the gastric body, on       the lesser curvature of the gastric body, at the incisura, on the       greater curvature of the gastric antrum and on the lesser curvature of       the gastric antrum with cold forceps for histology.      Multiple 3 to 4 mm sessile polyps with no bleeding and no stigmata of       recent bleeding were found in the gastric body. Biopsies were taken with       a cold forceps for histology.      The examined duodenum was normal. 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                       - Salmon-colored mucosa. Biopsied.                        - Salmon colored mucosa at 25 cm (likely inlet patch)                        at 25 cm mucosa in the esophagus. Biopsied.                       - Erythematous mucosa in the antrum. Biopsied.                       - Multiple gastric polyps. Biopsied.                       - Normal examined duodenum.                       - Biopsies were obtained on the greater curvature of                        the gastric body, on the lesser curvature of the                        gastric body, at the incisura, on the greater curvature                        of the gastric antrum and on the lesser curvature of                        the gastric antrum. Recommendation:       - Await pathology results.                       - Discharge patient to home (with escort).                       - Advance diet as tolerated.                       - Continue present medications.                       - Patient has a contact number available for                        emergencies. The signs and symptoms of potential                        delayed complications were discussed with the patient.                        Return to normal activities tomorrow. Written discharge                        instructions were provided to the patient.                       - Discharge patient to home (  with escort).                       - The findings and recommendations were discussed with                        the patient.                       - The findings and recommendations were discussed with                        the patient's family.                       - Decrease PPI to once daily from twice daily                       - Return to GI clinic in 2 months. Procedure Code(s):    --- Professional ---                       [phone], Esophagogastroduodenoscopy, flexible, transoral;                        with biopsy, single or multiple Diagnosis Code(s):    --- Professional ---                       K22.8, Other specified  diseases of esophagus                       K31.89, Other diseases of stomach and duodenum                       K31.7, Polyp of stomach and duodenum                       R10.11, Right upper quadrant pain CPT copyright 2016 American Medical Association. All rights reserved. The codes documented in this report are preliminary and upon coder review may  be revised to meet current compliance requirements.  [name], MD Michel Bickers B. Maximino Greenland MD, MD 05/07/2017 9:49:01 AM This report has been signed electronically. Number of Addenda: 0 Note Initiated On: 05/07/2017 9:07 AM      [facility]

## 2017-05-07 NOTE — H&P (Signed)
[name], MD [address], [place], [place], [address] [address], [address], [place], [place], [address] Phone: [phone]  Fax: [phone]  Primary Care Physician:  [name], PA-C   Pre-Procedure History & Physical: HPI:  Nathan Fox is a 52 y.o. male is here for an EGD.   Past Medical History:  Diagnosis Date  . GERD (gastroesophageal reflux disease)   . Hypertension   . PTSD (post-traumatic stress disorder)     Past Surgical History:  Procedure Laterality Date  . COLONOSCOPY WITH PROPOFOL N/A 09/23/2014   Procedure: COLONOSCOPY WITH PROPOFOL;  Surgeon: Midge Minium, MD;  Location: [facility];  Service: Endoscopy;  Laterality: N/A;  . ESOPHAGOGASTRODUODENOSCOPY (EGD) WITH PROPOFOL N/A 09/23/2014   Procedure: ESOPHAGOGASTRODUODENOSCOPY (EGD) WITH PROPOFOL;  Surgeon: Midge Minium, MD;  Location: [facility];  Service: Endoscopy;  Laterality: N/A;  gastric biopsy  . HERNIA REPAIR     x 2   . TONSILLECTOMY    . WRIST SURGERY Left    cyst    Prior to Admission medications   Medication Sig Start Date End Date Taking? Authorizing Provider  lisinopril (PRINIVIL,ZESTRIL) 10 MG tablet Take 1 tablet (10 mg total) by mouth daily. 02/24/17   Joni Reining, PA-C  meloxicam (MOBIC) 15 MG tablet Take 15 mg by mouth daily as needed. 01/14/17   [provider]  omeprazole (PRILOSEC) 20 MG capsule Take 1 capsule (20 mg total) 2 (two) times daily before a meal by mouth. 01/06/17 04/22/17  Pasty Spillers, MD    Allergies as of 04/28/2017  . (No Known Allergies)    Family History  Problem Relation Age of Onset  . Hypertension Mother   . Stroke Mother   . Arthritis Mother   . Asthma Mother   . Asthma Maternal Grandmother     Social History   Socioeconomic History  . Marital status: Married    Spouse name: Not on file  . Number of children: Not on file  . Years of education: Not on file  . Highest education level:  Not on file  Social Needs  . Financial resource strain: Not on file  . Food insecurity - worry: Not on file  . Food insecurity - inability: Not on file  . Transportation needs - medical: Not on file  . Transportation needs - non-medical: Not on file  Occupational History  . Not on file  Tobacco Use  . Smoking status: Never Smoker  . Smokeless tobacco: Former Neurosurgeon    Types: Snuff  Substance and Sexual Activity  . Alcohol use: Yes    Alcohol/week: 0.0 oz    Comment: occas.  . Drug use: No  . Sexual activity: Not on file  Other Topics Concern  . Not on file  Social History Narrative  . Not on file    Review of Systems: See HPI, otherwise negative ROS  Physical Exam: There were no vitals taken for this visit. General:   Alert,  pleasant and cooperative in NAD Head:  Normocephalic and atraumatic. Neck:  Supple; no masses or thyromegaly. Lungs:  Clear throughout to auscultation, normal respiratory effort.    Heart:  +S1, +S2, Regular rate and rhythm, No edema. Abdomen:  Soft, nontender and nondistended. Normal bowel sounds, without guarding, and without rebound.   Neurologic:  Alert and  oriented x4;  grossly normal neurologically.  Impression/Plan: NAETHAN BRACEWELL is here for an EGD for gastric intestinal metaplasia, and need for mapping  biopsies. Also has intemittent abdominal pain  Risks, benefits, limitations, and alternatives regarding the procedure have been reviewed with the patient.  Questions have been answered.  All parties agreeable.   Pasty SpillersVarnita B Walsie Smeltz, MD  05/07/2017, 8:32 AM

## 2017-05-08 ENCOUNTER — Telehealth: Payer: Self-pay | Admitting: Gastroenterology

## 2017-05-08 ENCOUNTER — Other Ambulatory Visit: Payer: Self-pay

## 2017-05-08 ENCOUNTER — Encounter: Payer: Self-pay | Admitting: Gastroenterology

## 2017-05-08 ENCOUNTER — Ambulatory Visit: Payer: Managed Care, Other (non HMO) | Admitting: Gastroenterology

## 2017-05-08 VITALS — BP 151/87 | HR 60 | Temp 98.2°F | Ht 65.0 in | Wt 173.8 lb

## 2017-05-08 DIAGNOSIS — R1084 Generalized abdominal pain: Secondary | ICD-10-CM

## 2017-05-08 DIAGNOSIS — R109 Unspecified abdominal pain: Secondary | ICD-10-CM | POA: Diagnosis not present

## 2017-05-08 MED ORDER — DICYCLOMINE HCL 10 MG PO CAPS: 10.0000 mg | ORAL_CAPSULE | Freq: Three times a day (TID) | ORAL | 1 refills | Status: DC | PRN

## 2017-05-08 NOTE — Telephone Encounter (Signed)
Pt left vm he had a [place] GI done yesterday and is still having Cramping please call pt

## 2017-05-08 NOTE — Telephone Encounter (Signed)
Pt coming into office to be seen today.

## 2017-05-08 NOTE — Patient Instructions (Signed)
F/U  3 months 

## 2017-05-09 ENCOUNTER — Encounter: Payer: Self-pay | Admitting: Gastroenterology

## 2017-05-09 LAB — COMPREHENSIVE METABOLIC PANEL
ALT: 18 IU/L (ref 0–44)
AST: 14 IU/L (ref 0–40)
Albumin/Globulin Ratio: 1.8 (ref 1.2–2.2)
Albumin: 4.4 g/dL (ref 3.5–5.5)
Alkaline Phosphatase: 72 IU/L (ref 39–117)
BUN/Creatinine Ratio: 10 (ref 9–20)
BUN: 11 mg/dL (ref 6–24)
Bilirubin Total: <0.2 mg/dL (ref 0.0–1.2)
CALCIUM: 8.8 mg/dL (ref 8.7–10.2)
CHLORIDE: 108 mmol/L — AB (ref 96–106)
CO2: 22 mmol/L (ref 20–29)
Creatinine, Ser: 1.06 mg/dL (ref 0.76–1.27)
GFR, EST AFRICAN AMERICAN: 93 mL/min/{1.73_m2} (ref >59)
GFR, EST NON AFRICAN AMERICAN: 80 mL/min/{1.73_m2} (ref >59)
GLUCOSE: 100 mg/dL — AB (ref 65–99)
Globulin, Total: 2.4 g/dL (ref 1.5–4.5)
Potassium: 4.2 mmol/L (ref 3.5–5.2)
Sodium: 145 mmol/L — ABNORMAL HIGH (ref 134–144)
TOTAL PROTEIN: 6.8 g/dL (ref 6.0–8.5)

## 2017-05-09 LAB — CBC WITH DIFFERENTIAL/PLATELET
BASOS: 0 %
Basophils Absolute: 0 10*3/uL (ref 0.0–0.2)
EOS (ABSOLUTE): 0.1 10*3/uL (ref 0.0–0.4)
EOS: 2 %
Hematocrit: 41.1 % (ref 37.5–51.0)
Hemoglobin: 14.3 g/dL (ref 13.0–17.7)
Immature Grans (Abs): 0 10*3/uL (ref 0.0–0.1)
Immature Granulocytes: 0 %
LYMPHS ABS: 2.1 10*3/uL (ref 0.7–3.1)
Lymphs: 36 %
MCH: 33.2 pg — ABNORMAL HIGH (ref 26.6–33.0)
MCHC: 34.8 g/dL (ref 31.5–35.7)
MCV: 95 fL (ref 79–97)
MONOCYTES: 10 %
MONOS ABS: 0.6 10*3/uL (ref 0.1–0.9)
Neutrophils Absolute: 2.9 10*3/uL (ref 1.4–7.0)
Neutrophils: 52 %
Platelets: 173 10*3/uL (ref 150–379)
RBC: 4.31 x10E6/uL (ref 4.14–5.80)
RDW: 12.6 % (ref 12.3–15.4)
WBC: 5.7 10*3/uL (ref 3.4–10.8)

## 2017-05-09 LAB — C-REACTIVE PROTEIN: CRP: 4.3 mg/L (ref 0.0–4.9)

## 2017-05-12 LAB — SURGICAL PATHOLOGY

## 2017-05-19 ENCOUNTER — Telehealth: Payer: Self-pay | Admitting: Gastroenterology

## 2017-05-19 NOTE — Telephone Encounter (Signed)
Patient calling for pathology results. 

## 2017-05-20 NOTE — Telephone Encounter (Signed)
See result note.  

## 2017-05-23 NOTE — Progress Notes (Signed)
[name], MD [address]  [address]  [place], [place] [address]  Main: [phone]  Fax: [phone]   Primary Care Physician: [name], PA-C  Primary Gastroenterologist:  Dr. [name]  Chief Complaint  Patient presents with  . Other    work in for stomach pain/spasms    HPI: Nathan Fox is a 52 y.o. male here for follow-up of intermittent right upper quadrant pain.  Initially seen on November 2018 for similar symptoms.  He was started on Prilosec after his initial visit.  Pain has improved since then.  He is now reporting midepigastric spasm, intermittently, unrelated to meals.  No radiation.  No nausea vomiting.  No dysphagia.  No heartburn.  He underwent EGD on May 07, 2017, due to his abdominal pain, and also follow-up for gastric intestinal metaplasia seen in 2016 EGD.  EGD showed salmon colored mucosa at the GE junction, and an inlet patch 25 cm.  Erythematous gastric mucosa.  Multiple gastric polyps.  Gastric mapping biopsies were done.  DIAGNOSIS:  A. STOMACH, ANTRUM, GREATER CURVATURE; COLD BIOPSY:  - MILD REACTIVE GASTROPATHY.  - THREE SMALL FOCI OF INTESTINAL METAPLASIA IN 3 FRAGMENTS, REPRESENTING  10% OF AGGREGATE MUCOSAL SURFACE, SEE COMMENT.  - NEGATIVE FOR ACTIVE INFLAMMATION, DYSPLASIA, AND MALIGNANCY.   B. STOMACH, ANTRUM; LESSER CURVATURE; COLD BIOPSY:  - MILD TO MODERATE REACTIVE GASTROPATHY.  - ONE SMALL FOCUS OF INTESTINAL METAPLASIA, REPRESENTING LESS THAN 10%  OF MUCOSA.  - NEGATIVE FOR ACTIVE INFLAMMATION, DYSPLASIA, AND MALIGNANCY.   C. STOMACH, ANGULARIS; COLD BIOPSY:  - OXYNTIC AND TRANSITIONAL-TYPE MUCOSA WITH MILD NONSPECIFIC CHRONIC  INFLAMMATION.  - NEGATIVE FOR INTESTINAL METAPLASIA, ATROPHY, DYSPLASIA, AND  MALIGNANCY.   D. STOMACH, BODY, GREATER CURVATURE; COLD BIOPSY:  - OXYNTIC MUCOSA WITH PROTON PUMP INHIBITOR EFFECT.  - NEGATIVE FOR INTESTINAL METAPLASIA, DYSPLASIA, AND MALIGNANCY.   E.  STOMACH, BODY, LESSER CURVATURE; COLD BIOPSY:  - OXYNTIC MUCOSA WITH PROTON PUMP INHIBITOR EFFECT.  - NEGATIVE FOR INTESTINAL METAPLASIA, DYSPLASIA, AND MALIGNANCY.   F. STOMACH POLYP, COLD BIOPSY:  - FUNDIC GLAND POLYP, 3 FRAGMENTS.  - NEGATIVE FOR INTESTINAL METAPLASIA, DYSPLASIA, AND MALIGNANCY.   G. GASTROESOPHAGEAL JUNCTION; COLD BIOPSY:  - SQUAMOCOLUMNAR MUCOSA WITH FOCAL INTESTINAL METAPLASIA (GOBLET CELLS)  AND MILD CHRONIC INFLAMMATION.  - NEGATIVE FOR DYSPLASIA AND MALIGNANCY.  - THE HISTOLOGY IS CONSISTENT WITH BARRETT'S ESOPHAGUS IF SUPPORTED BY  ENDOSCOPIC APPEARANCE.   H. ESOPHAGUS, 25 CM; COLD BIOPSY:  - DETACHED FRAGMENTS OF STRATIFIED SQUAMOUS EPITHELIUM WITH MILD  GLYCOGENIC ACANTHOSIS.  - NEGATIVE FOR COLUMNAR EPITHELIUM, DYSPLASIA, AND MALIGNANCY.   Comment:  The stomach shows limited involvement by intestinal metaplasia (complete  type), with a few small foci in the antrum only. The antrum shows  foveolar hyperplasia, likely due to medication effect/chemical  gastritis. There are no morphologic changes to suggest H. pylori  gastritis, and all of the biopsies are negative for H. pylori organisms  on hematoxylin and eosin sections. I note recent negative H. pylori  serology as well as August 2016 gastric biopsies negative for H. pylori  by Warthin-Starry stain. No gastric mucosal atrophy is identified at any  of the sites. All of the specimens are negative for dysplasia and  malignancy.   Previous history:  Previous records show that pt. Had complained of similar symptoms in 2016 when he was seen by Dr. Servando Snare. Colonoscopy at the time was normal except for internal hemorrhoids, EGD showed mild gastritis showed focal intestinal metaplasia, reactive chemical  gastritis, No H. Pylori. Random colon biopsy was normal. H. Pylori serology was negative. Abd U/S on Nov 1st showed a normal gallbladder, no gallstones, mildly increased hepatic echotexture most compatible with  fatty liver.  Last colonoscopy was in 2016, internal hemorrhoids, normal exam, excellent prep and repeat recommended in 10 years.  Random colon biopsies at the time were normal.    Current Outpatient Medications  Medication Sig Dispense Refill  . lisinopril (PRINIVIL,ZESTRIL) 10 MG tablet Take 1 tablet (10 mg total) by mouth daily. 30 tablet 5  . omeprazole (PRILOSEC) 20 MG capsule Take 1 capsule (20 mg total) by mouth daily. 30 capsule 1  . dicyclomine (BENTYL) 10 MG capsule Take 1 capsule (10 mg total) by mouth 3 (three) times daily as needed for spasms. 40 capsule 1  . meloxicam (MOBIC) 15 MG tablet Take 15 mg by mouth daily as needed.  2   No current facility-administered medications for this visit.     Allergies as of 05/08/2017  . (No Known Allergies)    ROS:  General: Negative for anorexia, weight loss, fever, chills, fatigue, weakness. ENT: Negative for hoarseness, difficulty swallowing , nasal congestion. CV: Negative for chest pain, angina, palpitations, dyspnea on exertion, peripheral edema.  Respiratory: Negative for dyspnea at rest, dyspnea on exertion, cough, sputum, wheezing.  GI: See history of present illness. GU:  Negative for dysuria, hematuria, urinary incontinence, urinary frequency, nocturnal urination.  Endo: Negative for unusual weight change.    Physical Examination:   BP (!) 151/87   Pulse 60   Temp 98.2 F (36.8 C) (Oral)   Ht 5\' 5"  (1.651 m)   Wt 173 lb 12.8 oz (78.8 kg)   BMI 28.92 kg/m   General: Well-nourished, well-developed in no acute distress.  Eyes: No icterus. Conjunctivae pink. Mouth: Oropharyngeal mucosa moist and pink , no lesions erythema or exudate. Neck: Supple, Trachea midline Abdomen: Bowel sounds are normal, nontender, nondistended, no hepatosplenomegaly or masses, no abdominal bruits or hernia , no rebound or guarding.   Extremities: No lower extremity edema. No clubbing or deformities. Neuro: Alert and oriented x 3.   Grossly intact. Skin: Warm and dry, no jaundice.   Psych: Alert and cooperative, normal mood and affect.   Labs: CMP     Component Value Date/Time   NA 145 (H) 05/08/2017 1605   K 4.2 05/08/2017 1605   CL 108 (H) 05/08/2017 1605   CO2 22 05/08/2017 1605   GLUCOSE 100 (H) 05/08/2017 1605   BUN 11 05/08/2017 1605   CREATININE 1.06 05/08/2017 1605   CALCIUM 8.8 05/08/2017 1605   PROT 6.8 05/08/2017 1605   ALBUMIN 4.4 05/08/2017 1605   AST 14 05/08/2017 1605   ALT 18 05/08/2017 1605   ALKPHOS 72 05/08/2017 1605   BILITOT <0.2 05/08/2017 1605   GFRNONAA 80 05/08/2017 1605   GFRAA 93 05/08/2017 1605   Lab Results  Component Value Date   WBC 5.7 05/08/2017   HGB 14.3 05/08/2017   HCT 41.1 05/08/2017   MCV 95 05/08/2017   PLT 173 05/08/2017    Imaging Studies: No results found.  Assessment and Plan:   JAVONN GAUGER is a 52 y.o. y/o male initially referred for right upper quadrant abdominal pain, with imaging, endoscopy unrevealing of any etiology of his pain, now reporting improvement in right upper quadrant pain with PPI, but has midepigastric spasm  We will try dicyclomine as needed for abdominal spasm No alarming findings on extensive workup so far Educated  against NSAID use  Gastric mapping biopsies did not show any dysplasia, was complete, and only limited to the antrum. This is this low risk for advancing to cancer  We will continue to follow in clinic, and repeat Biopsies can be considered in 2-3 years   Patient educated extensively on acid reflux lifestyle modification, including buying a bed wedge, not eating 3 hrs before bedtime, diet modifications, and handout given for the same.   His symptoms are likely to be related to biliary colic, given ultrasound did not show any stones in his gallbladder. In addition, right upper quadrant pain has overall improved with PPI  We will continue to follow in clinic, and if pain continues, can consider surgery referral,  to evaluate gallbladder function with HIDA scan  Dr Melodie BouillonVarnita Shiela Bruns

## 2017-07-03 ENCOUNTER — Other Ambulatory Visit: Payer: Self-pay | Admitting: Gastroenterology

## 2017-07-10 ENCOUNTER — Other Ambulatory Visit: Payer: Self-pay | Admitting: Gastroenterology

## 2017-08-11 ENCOUNTER — Ambulatory Visit: Payer: Managed Care, Other (non HMO) | Admitting: Gastroenterology

## 2017-08-11 ENCOUNTER — Encounter (INDEPENDENT_AMBULATORY_CARE_PROVIDER_SITE_OTHER): Payer: Self-pay

## 2017-08-11 ENCOUNTER — Encounter: Payer: Self-pay | Admitting: Gastroenterology

## 2017-08-11 VITALS — BP 125/77 | HR 59 | Ht 65.0 in | Wt 171.0 lb

## 2017-08-11 DIAGNOSIS — K3189 Other diseases of stomach and duodenum: Secondary | ICD-10-CM

## 2017-08-11 DIAGNOSIS — K760 Fatty (change of) liver, not elsewhere classified: Secondary | ICD-10-CM

## 2017-08-11 DIAGNOSIS — R1011 Right upper quadrant pain: Secondary | ICD-10-CM

## 2017-08-11 DIAGNOSIS — K31A0 Gastric intestinal metaplasia, unspecified: Secondary | ICD-10-CM

## 2017-08-11 DIAGNOSIS — K2270 Barrett's esophagus without dysplasia: Secondary | ICD-10-CM

## 2017-08-11 NOTE — Patient Instructions (Signed)
F/U 6 months   Costochondritis Costochondritis is swelling and irritation (inflammation) of the tissue (cartilage) that connects your ribs to your breastbone (sternum). This causes pain in the front of your chest. Usually, the pain:  Starts gradually.  Is in more than one rib.  This condition usually goes away on its own over time. Follow these instructions at home:  Do not do anything that makes your pain worse.  If directed, put ice on the painful area: ? Put ice in a plastic bag. ? Place a towel between your skin and the bag. ? Leave the ice on for 20 minutes, 2-3 times a day.  If directed, put heat on the affected area as often as told by your doctor. Use the heat source that your doctor tells you to use, such as a moist heat pack or a heating pad. ? Place a towel between your skin and the heat source. ? Leave the heat on for 20-30 minutes. ? Take off the heat if your skin turns bright red. This is very important if you cannot feel pain, heat, or cold. You may have a greater risk of getting burned.  Take over-the-counter and prescription medicines only as told by your doctor.  Return to your normal activities as told by your doctor. Ask your doctor what activities are safe for you.  Keep all follow-up visits as told by your doctor. This is important. Contact a doctor if:  You have chills or a fever.  Your pain does not go away or it gets worse.  You have a cough that does not go away. Get help right away if:  You are short of breath. This information is not intended to replace advice given to you by your health care provider. Make sure you discuss any questions you have with your health care provider. Document Released: 07/24/2007 Document Revised: 08/25/2015 Document Reviewed: 05/31/2015 Elsevier Interactive Patient Education  [employer]2018 [employer]

## 2017-08-11 NOTE — Progress Notes (Signed)
[name], MD [address]  [address]  [place], [place] [address]  Main: [phone]  Fax: [phone]   Primary Care Physician: [name], PA-C  Primary Gastroenterologist:  Dr. [name]  Chief Complaint  Patient presents with  . Follow-up    intermittent RUQ pain (not as intense but continues)    HPI: Nathan Fox is a 52 y.o. male with history of intestinal metaplasia on gastric biopsies, and Barrett's esophagus, here for follow-up of intermittent right upper quadrant pain.  Reports pain has improved overall, but occurs intermittently.  Describes it as a pinching type of pain.  Occurs about 1 hour after eating, and last for 15 minutes.  No nausea or vomiting associated with these episodes.  No significant weight loss.  No altered bowel habits.  Takes MiraLAX daily, and says his pain has overall improved with MiraLAX.  Denies any heartburn, dyspepsia.  Has stopped taking Bentyl, and Mobic.  Is taking Prilosec.  Previous history:  He underwent EGD on May 07, 2017, due to his abdominal pain, and also follow-up for gastric intestinal metaplasia seen in 2016 EGD.  EGD showed salmon colored mucosa at the GE junction, and an inlet patch 25 cm.  Erythematous gastric mucosa.  Multiple gastric polyps.  Gastric mapping biopsies were done.  Pathology as per below, in brief gastric intestinal metaplasia was seen in the antrum, complete type.  Goblet cells seen at the GE junction biopsies.  DIAGNOSIS:  A. STOMACH, ANTRUM, GREATER CURVATURE; COLD BIOPSY:  - MILD REACTIVE GASTROPATHY.  - THREE SMALL FOCI OF INTESTINAL METAPLASIA IN 3 FRAGMENTS, REPRESENTING  10% OF AGGREGATE MUCOSAL SURFACE, SEE COMMENT.  - NEGATIVE FOR ACTIVE INFLAMMATION, DYSPLASIA, AND MALIGNANCY.   B. STOMACH, ANTRUM; LESSER CURVATURE; COLD BIOPSY:  - MILD TO MODERATE REACTIVE GASTROPATHY.  - ONE SMALL FOCUS OF INTESTINAL METAPLASIA, REPRESENTING LESS THAN 10%  OF MUCOSA.  - NEGATIVE  FOR ACTIVE INFLAMMATION, DYSPLASIA, AND MALIGNANCY.   C. STOMACH, ANGULARIS; COLD BIOPSY:  - OXYNTIC AND TRANSITIONAL-TYPE MUCOSA WITH MILD NONSPECIFIC CHRONIC  INFLAMMATION.  - NEGATIVE FOR INTESTINAL METAPLASIA, ATROPHY, DYSPLASIA, AND  MALIGNANCY.   D. STOMACH, BODY, GREATER CURVATURE; COLD BIOPSY:  - OXYNTIC MUCOSA WITH PROTON PUMP INHIBITOR EFFECT.  - NEGATIVE FOR INTESTINAL METAPLASIA, DYSPLASIA, AND MALIGNANCY.   E. STOMACH, BODY, LESSER CURVATURE; COLD BIOPSY:  - OXYNTIC MUCOSA WITH PROTON PUMP INHIBITOR EFFECT.  - NEGATIVE FOR INTESTINAL METAPLASIA, DYSPLASIA, AND MALIGNANCY.   F. STOMACH POLYP, COLD BIOPSY:  - FUNDIC GLAND POLYP, 3 FRAGMENTS.  - NEGATIVE FOR INTESTINAL METAPLASIA, DYSPLASIA, AND MALIGNANCY.   G. GASTROESOPHAGEAL JUNCTION; COLD BIOPSY:  - SQUAMOCOLUMNAR MUCOSA WITH FOCAL INTESTINAL METAPLASIA (GOBLET CELLS)  AND MILD CHRONIC INFLAMMATION.  - NEGATIVE FOR DYSPLASIA AND MALIGNANCY.  - THE HISTOLOGY IS CONSISTENT WITH BARRETT'S ESOPHAGUS IF SUPPORTED BY  ENDOSCOPIC APPEARANCE.   H. ESOPHAGUS, 25 CM; COLD BIOPSY:  - DETACHED FRAGMENTS OF STRATIFIED SQUAMOUS EPITHELIUM WITH MILD  GLYCOGENIC ACANTHOSIS.  - NEGATIVE FOR COLUMNAR EPITHELIUM, DYSPLASIA, AND MALIGNANCY.   Comment:  The stomach shows limited involvement by intestinal metaplasia (complete  type), with a few small foci in the antrum only. The antrum shows  foveolar hyperplasia, likely due to medication effect/chemical  gastritis. There are no morphologic changes to suggest H. pylori  gastritis, and all of the biopsies are negative for H. pylori organisms  on hematoxylin and eosin sections. I note recent negative H. pylori  serology as well as August 2016 gastric biopsies negative for H.  pylori  by Warthin-Starry stain. No gastric mucosal atrophy is identified at any  of the sites. All of the specimens are negative for dysplasia and  malignancy.    Previous records show that pt. Had  complained of similar symptoms in 2016 when he was seen by Dr. Servando Snare. Colonoscopy at the time was normal except for internal hemorrhoids, EGD showed mild gastritis showed focal intestinal metaplasia, reactive chemical gastritis, No H. Pylori. Random colon biopsy was normal. H. Pylori serology was negative. Abd U/S on Nov 1st showed a normal gallbladder, no gallstones, mildly increased hepatic echotexture most compatible with fatty liver.  Last colonoscopy was in 2016, internal hemorrhoids, normal exam, excellent prep and repeat recommended in 10 years. Random colon biopsies at the time were normal.    Current Outpatient Medications  Medication Sig Dispense Refill  . lisinopril (PRINIVIL,ZESTRIL) 10 MG tablet Take 1 tablet (10 mg total) by mouth daily. 30 tablet 5  . omeprazole (PRILOSEC) 20 MG capsule TAKE 1 CAPSULE BY MOUTH EVERY DAY 30 capsule 1  . dicyclomine (BENTYL) 10 MG capsule Take 1 capsule (10 mg total) by mouth 3 (three) times daily as needed for spasms. (Patient not taking: Reported on 08/11/2017) 40 capsule 1  . meloxicam (MOBIC) 15 MG tablet Take 15 mg by mouth daily as needed.  2   No current facility-administered medications for this visit.     Allergies as of 08/11/2017  . (No Known Allergies)    ROS:  General: Negative for anorexia, weight loss, fever, chills, fatigue, weakness. ENT: Negative for hoarseness, difficulty swallowing , nasal congestion. CV: Negative for chest pain, angina, palpitations, dyspnea on exertion, peripheral edema.  Respiratory: Negative for dyspnea at rest, dyspnea on exertion, cough, sputum, wheezing.  GI: See history of present illness. GU:  Negative for dysuria, hematuria, urinary incontinence, urinary frequency, nocturnal urination.  Endo: Negative for unusual weight change.    Physical Examination:   BP 125/77   Pulse (!) 59   Ht 5\' 5"  (1.651 m)   Wt 171 lb (77.6 kg)   BMI 28.46 kg/m   General: Well-nourished, well-developed in no  acute distress.  Eyes: No icterus. Conjunctivae pink. Mouth: Oropharyngeal mucosa moist and pink , no lesions erythema or exudate. Neck: Supple, Trachea midline Abdomen: Bowel sounds are normal, nontender, nondistended, no hepatosplenomegaly or masses, no abdominal bruits or hernia , no rebound or guarding.   Extremities: No lower extremity edema. No clubbing or deformities. Neuro: Alert and oriented x 3.  Grossly intact. Skin: Warm and dry, no jaundice.   Psych: Alert and cooperative, normal mood and affect.   Labs: CMP     Component Value Date/Time   NA 145 (H) 05/08/2017 1605   K 4.2 05/08/2017 1605   CL 108 (H) 05/08/2017 1605   CO2 22 05/08/2017 1605   GLUCOSE 100 (H) 05/08/2017 1605   BUN 11 05/08/2017 1605   CREATININE 1.06 05/08/2017 1605   CALCIUM 8.8 05/08/2017 1605   PROT 6.8 05/08/2017 1605   ALBUMIN 4.4 05/08/2017 1605   AST 14 05/08/2017 1605   ALT 18 05/08/2017 1605   ALKPHOS 72 05/08/2017 1605   BILITOT <0.2 05/08/2017 1605   GFRNONAA 80 05/08/2017 1605   GFRAA 93 05/08/2017 1605   Lab Results  Component Value Date   WBC 5.7 05/08/2017   HGB 14.3 05/08/2017   HCT 41.1 05/08/2017   MCV 95 05/08/2017   PLT 173 05/08/2017    Imaging Studies: No results found.  Assessment and  Plan:   Nathan Fox is a 52 y.o. y/o male here for intermittent right upper quadrant pain, complete type gastric intestinal metaplasia noted in the antrum on gastric mapping biopsies, Barrett's signified by goblet cells on GE junction biopsies  Intermittent right upper quadrant pain Overall improved, but patient reports symptoms occur 1 hour after meals especially with heavy meals Abdominal ultrasound was negative However, correlation with meals could be due to biliary colic Will refer to surgery for evaluation.   Continue MiraLAX as it is helping overall with pain Costochondritis is also possibility, however, it would not explain the correlation with meals, therefore  surgery referral is indicated Patient educated on costochondritis, using Tylenol as needed, and use warm and cold packs as needed  Barrett's esophagus Symptomatic heartburn resolved, with PPI Continue PPI due to underlying Barrett's, and to prevent worsening Patient educated extensively on acid reflux lifestyle modification, including buying a bed wedge, not eating 3 hrs before bedtime, diet modifications, and handout given for the same.   Gastric intestinal metaplasia No dysplasia Complete type only limited to the antrum As per literature, this is low risk for advancing to cancer We will continue to follow in clinic, and repeat biopsies in 2 to 3 years No alarm symptoms present  Fatty liver on ultrasound in November 2018 Patient educated about fatty liver Diet, weight loss, and exercise encouraged Avoid hepatotoxic dose including alcohol Avoid heavy Tylenol use No evidence of cirrhosis Normal liver enzymes  Dr Melodie BouillonVarnita Drewey Begue

## 2017-08-26 ENCOUNTER — Telehealth: Payer: Self-pay

## 2017-08-26 NOTE — Telephone Encounter (Signed)
Pt left v/m stating he "needs a refill for his BP medication." Pt stated he is "aware he needs a PCP for future refills" but is asking for "one more refill until that happens". Pt has yet to set up an appointment for a PCP.

## 2017-08-27 ENCOUNTER — Other Ambulatory Visit: Payer: Self-pay | Admitting: Family Medicine

## 2017-08-27 DIAGNOSIS — I10 Essential (primary) hypertension: Secondary | ICD-10-CM

## 2017-08-27 MED ORDER — LISINOPRIL 10 MG PO TABS: 10.0000 mg | ORAL_TABLET | Freq: Every day | ORAL | 0 refills | Status: DC

## 2017-08-27 NOTE — Telephone Encounter (Signed)
Thank you! Sending it now

## 2017-08-27 NOTE — Telephone Encounter (Signed)
Spoke to pt, told him he will be receiving one refill and to make an appointment with a PCP asap. Pt confirmed he is taking Lisinopril and Omeprazole. Current pharmacy [facility]

## 2017-08-27 NOTE — Progress Notes (Signed)
Patient requested a refill for lisinopril so that he doesn't run out before his first apt with his new PCP to establish care. Provided patient with one refill.

## 2017-08-27 NOTE — Telephone Encounter (Signed)
I will provide him with one refill so tell him to call today to make an apt asap with a PCP for future refills. Will you verify what medications he is taking and his pharmacy?

## 2017-09-18 ENCOUNTER — Other Ambulatory Visit: Payer: Self-pay | Admitting: Gastroenterology

## 2017-09-22 ENCOUNTER — Telehealth: Payer: Self-pay

## 2017-09-22 NOTE — Telephone Encounter (Signed)
Received refill request for Lisinopril. Pt gave verbal understanding for last refill to get a PCP for future refills.

## 2017-09-22 NOTE — Telephone Encounter (Signed)
Please advise the pharmacy that refills need to come from the patient's primary care provider.  Patient informed on 08/27/2017 that refills need to come from his primary care provider.

## 2017-09-22 NOTE — Telephone Encounter (Signed)
Pharmacy notified.

## 2017-10-02 ENCOUNTER — Encounter: Payer: Self-pay | Admitting: Family Medicine

## 2017-10-02 ENCOUNTER — Ambulatory Visit: Payer: Managed Care, Other (non HMO) | Admitting: Family Medicine

## 2017-10-02 VITALS — BP 142/92 | HR 66 | Temp 98.0°F | Resp 16 | Ht 66.5 in | Wt 174.0 lb

## 2017-10-02 DIAGNOSIS — I10 Essential (primary) hypertension: Secondary | ICD-10-CM

## 2017-10-02 DIAGNOSIS — Z125 Encounter for screening for malignant neoplasm of prostate: Secondary | ICD-10-CM

## 2017-10-02 DIAGNOSIS — K219 Gastro-esophageal reflux disease without esophagitis: Secondary | ICD-10-CM

## 2017-10-02 MED ORDER — LISINOPRIL 10 MG PO TABS: 10.0000 mg | ORAL_TABLET | Freq: Every day | ORAL | 1 refills | Status: DC

## 2017-10-02 MED ORDER — LISINOPRIL 10 MG PO TABS: 10.0000 mg | ORAL_TABLET | Freq: Every day | ORAL | 0 refills | Status: DC

## 2017-10-02 MED ORDER — OMEPRAZOLE 20 MG PO CPDR: 20.0000 mg | DELAYED_RELEASE_CAPSULE | Freq: Every day | ORAL | 1 refills | Status: DC

## 2017-10-02 NOTE — Progress Notes (Signed)
Subjective:    Patient ID: Nathan Fox, male    DOB: 11/29/1965, 52 y.o.   MRN: [id number]  HPI   Patient presents to clinic to establish care.  Patient has hypertension and takes lisinopril 10 mg daily.  He tries to keep himself active with regular exercise few times a week and follows a healthy diet with lean meats, vegetables fruits and small amounts of bread/pasta.  He takes omeprazole for acid reflux.  He has had 2 endoscopies with stomach tissue biopsy by GI --he follows regularly with GI for his GERD.  He had colonoscopy at age 52 and was told to return in 10 years.  Patient Active Problem List   Diagnosis Date Noted  . Abdominal pain, right upper quadrant   . Intestinal metaplasia of gastric mucosa   . Stomach irritation   . Columnar epithelial-lined lower esophagus   . Change in bowel habits   . Second degree hemorrhoids   . Gastric pain   . Gastritis   . Acid reflux 08/30/2014  . BP (high blood pressure) 08/30/2014   Social History   Tobacco Use  . Smoking status: Never Smoker  . Smokeless tobacco: Former NeurosurgeonUser    Types: Snuff  Substance Use Topics  . Alcohol use: Yes    Alcohol/week: 0.0 standard drinks    Comment: occas.   Family History  Problem Relation Age of Onset  . Hypertension Mother   . Stroke Mother   . Arthritis Mother   . Asthma Mother   . Asthma Maternal Grandmother    Review of Systems  Constitutional: Negative for chills, fatigue and fever.  HENT: Negative for congestion, ear pain, sinus pain and sore throat.   Eyes: Negative.   Respiratory: Negative for cough, shortness of breath and wheezing.   Cardiovascular: Negative for chest pain, palpitations and leg swelling.  Gastrointestinal: Negative for abdominal pain, diarrhea, nausea and vomiting.  Genitourinary: Negative for dysuria, frequency and urgency.  Musculoskeletal: Negative for arthralgias and myalgias.  Skin: Negative for color change, pallor and rash.  Neurological:  Negative for syncope, light-headedness and headaches.  Psychiatric/Behavioral: The patient is not nervous/anxious.       Objective:   Physical Exam  Constitutional: He appears well-developed and well-nourished. No distress.  Head: Normocephalic and atraumatic.  Eyes: Pupils are equal, round, and reactive to light. Conjunctivae and EOM are normal. No scleral icterus.  Neck: Normal range of motion. Neck supple. No tracheal deviation present.  Cardiovascular: Normal rate, regular rhythm and normal heart sounds.  Pulmonary/Chest: Effort normal and breath sounds normal. No respiratory distress.   Neurological: He is alert and oriented to person, place, and time.  Gait normal  Skin: Skin is warm and dry. He is not diaphoretic. No pallor.  Psychiatric: He has a normal mood and affect. His behavior is normal. Thought content normal.   Nursing note and vitals reviewed.     Vitals:   10/02/17 1454 10/02/17 1515  BP: (!) 160/88 (!) 142/92  Pulse: 66   Resp: 16   Temp: 98 F (36.7 C)   SpO2: 97%     Assessment & Plan:    A total of 30  minutes were spent face-to-face with the patient during this encounter and over half of that time was spent on counseling and coordination of care. The patient was counseled on HTN managment.   Hypertension --we will get new lab work in clinic.  Patient will continue lisinopril 10 mg daily as this has  been effective for him.  Patient's blood pressure readings were slightly elevated in clinic today, but patient states he has had a stressful past few weeks due to mother-in-law's dementia worsening.  Patient would prefer to monitor blood pressure for now and if continues to be elevated will be agreeable to change medications in the future.  GERD -- is stable on omeprazole 20 mg.  Prostate cancer screening- PSA level ordered and lab work and next visit we will do a Wellness CPE   Follow up in 6 months for CPE

## 2017-10-02 NOTE — Patient Instructions (Signed)
Great to meet you!  Lab work in clinic today

## 2017-10-03 LAB — CBC WITH DIFFERENTIAL/PLATELET
BASOS PCT: 0.8 % (ref 0.0–3.0)
Basophils Absolute: 0 10*3/uL (ref 0.0–0.1)
Eosinophils Absolute: 0.1 10*3/uL (ref 0.0–0.7)
Eosinophils Relative: 1.3 % (ref 0.0–5.0)
HCT: 44.3 % (ref 39.0–52.0)
HEMOGLOBIN: 15.2 g/dL (ref 13.0–17.0)
Lymphocytes Relative: 35.2 % (ref 12.0–46.0)
Lymphs Abs: 1.9 10*3/uL (ref 0.7–4.0)
MCHC: 34.3 g/dL (ref 30.0–36.0)
MCV: 97.2 fl (ref 78.0–100.0)
Monocytes Absolute: 0.5 10*3/uL (ref 0.1–1.0)
Monocytes Relative: 9.8 % (ref 3.0–12.0)
Neutro Abs: 2.9 10*3/uL (ref 1.4–7.7)
Neutrophils Relative %: 52.9 % (ref 43.0–77.0)
Platelets: 182 10*3/uL (ref 150.0–400.0)
RBC: 4.55 Mil/uL (ref 4.22–5.81)
RDW: 12.8 % (ref 11.5–15.5)
WBC: 5.5 10*3/uL (ref 4.0–10.5)

## 2017-10-03 LAB — LIPID PANEL
CHOL/HDL RATIO: 3
CHOLESTEROL: 146 mg/dL (ref 0–200)
HDL: 44.7 mg/dL (ref >39.00)
Triglycerides: 419 mg/dL — ABNORMAL HIGH (ref 0.0–149.0)

## 2017-10-03 LAB — COMPREHENSIVE METABOLIC PANEL
ALBUMIN: 4.5 g/dL (ref 3.5–5.2)
ALK PHOS: 67 U/L (ref 39–117)
ALT: 21 U/L (ref 0–53)
AST: 18 U/L (ref 0–37)
BUN: 17 mg/dL (ref 6–23)
CHLORIDE: 104 meq/L (ref 96–112)
CO2: 28 mEq/L (ref 19–32)
CREATININE: 1.32 mg/dL (ref 0.40–1.50)
Calcium: 9.6 mg/dL (ref 8.4–10.5)
GFR: 60.29 mL/min (ref >60.00)
Glucose, Bld: 100 mg/dL — ABNORMAL HIGH (ref 70–99)
POTASSIUM: 3.9 meq/L (ref 3.5–5.1)
SODIUM: 139 meq/L (ref 135–145)
Total Bilirubin: 0.5 mg/dL (ref 0.2–1.2)
Total Protein: 7.2 g/dL (ref 6.0–8.3)

## 2017-10-03 LAB — PSA, TOTAL AND FREE
PSA, % Free: 33 % (calc) (ref >25)
PSA, Free: 0.1 ng/mL
PSA, Total: 0.3 ng/mL (ref <=4.0)

## 2017-10-03 LAB — LDL CHOLESTEROL, DIRECT: LDL DIRECT: 68 mg/dL

## 2017-10-03 LAB — TSH: TSH: 0.95 u[IU]/mL (ref 0.35–4.50)

## 2017-10-07 ENCOUNTER — Telehealth: Payer: Self-pay | Admitting: Family Medicine

## 2017-10-07 NOTE — Telephone Encounter (Signed)
Ok thank you 

## 2017-10-07 NOTE — Telephone Encounter (Signed)
FYI

## 2017-10-07 NOTE — Telephone Encounter (Unsigned)
Copied from CRM [id number]. Topic: Quick Communication - See Telephone Encounter >> Oct 07, 2017 11:15 AM Raquel SarnaHayes, Teresa G wrote: Triglicrites was high.  Pt wants to go the diet and exercise route.

## 2017-10-22 ENCOUNTER — Ambulatory Visit: Payer: Self-pay | Admitting: Gastroenterology

## 2017-10-23 ENCOUNTER — Ambulatory Visit: Payer: Self-pay | Admitting: Gastroenterology

## 2017-10-29 ENCOUNTER — Ambulatory Visit: Payer: Self-pay | Admitting: Gastroenterology

## 2017-11-26 ENCOUNTER — Encounter: Payer: Self-pay | Admitting: Emergency Medicine

## 2017-11-26 ENCOUNTER — Ambulatory Visit: Payer: Self-pay | Admitting: Emergency Medicine

## 2017-11-26 VITALS — BP 140/88 | HR 80 | Temp 98.6°F | Resp 14 | Ht 66.0 in | Wt 171.0 lb

## 2017-11-26 DIAGNOSIS — R05 Cough: Secondary | ICD-10-CM

## 2017-11-26 DIAGNOSIS — J301 Allergic rhinitis due to pollen: Secondary | ICD-10-CM

## 2017-11-26 DIAGNOSIS — R059 Cough, unspecified: Secondary | ICD-10-CM

## 2017-11-26 LAB — POCT RAPID STREP A (OFFICE): RAPID STREP A SCREEN: NEGATIVE

## 2017-11-26 MED ORDER — BENZONATATE 100 MG PO CAPS: 100.0000 mg | ORAL_CAPSULE | Freq: Three times a day (TID) | ORAL | 0 refills | Status: DC | PRN

## 2017-11-26 MED ORDER — AZELASTINE HCL 0.1 % NA SOLN: 2.0000 | Freq: Two times a day (BID) | NASAL | Status: DC

## 2017-11-26 MED ORDER — AZELASTINE HCL 0.15 % NA SOLN: NASAL | 0 refills | Status: DC

## 2017-11-26 NOTE — Patient Instructions (Signed)
Take Zyrtec 1 a day. Use Tessalon Perles as needed for cough. Use Astepro twice a day. Please follow your blood pressure at home and if it remains elevated contact your primary care physician. Use saline nasal spray twice a day.Allergic Rhinitis, Adult Allergic rhinitis is an allergic reaction that affects the mucous membrane inside the nose. It causes sneezing, a runny or stuffy nose, and the feeling of mucus going down the back of the throat (postnasal drip). Allergic rhinitis can be mild to severe. There are two types of allergic rhinitis:  Seasonal. This type is also called hay fever. It happens only during certain seasons.  Perennial. This type can happen at any time of the year.  What are the causes? This condition happens when the body's defense system (immune system) responds to certain harmless substances called allergens as though they were germs.  Seasonal allergic rhinitis is triggered by pollen, which can come from grasses, trees, and weeds. Perennial allergic rhinitis may be caused by:  House dust mites.  Pet dander.  Mold spores.  What are the signs or symptoms? Symptoms of this condition include:  Sneezing.  Runny or stuffy nose (nasal congestion).  Postnasal drip.  Itchy nose.  Tearing of the eyes.  Trouble sleeping.  Daytime sleepiness.  How is this diagnosed? This condition may be diagnosed based on:  Your medical history.  A physical exam.  Tests to check for related conditions, such as: ? Asthma. ? Pink eye. ? Ear infection. ? Upper respiratory infection.  Tests to find out which allergens trigger your symptoms. These may include skin or blood tests.  How is this treated? There is no cure for this condition, but treatment can help control symptoms. Treatment may include:  Taking medicines that block allergy symptoms, such as antihistamines. Medicine may be given as a shot, nasal spray, or pill.  Avoiding the allergen.  Desensitization.  This treatment involves getting ongoing shots until your body becomes less sensitive to the allergen. This treatment may be done if other treatments do not help.  If taking medicine and avoiding the allergen does not work, new, stronger medicines may be prescribed.  Follow these instructions at home:  Find out what you are allergic to. Common allergens include smoke, dust, and pollen.  Avoid the things you are allergic to. These are some things you can do to help avoid allergens: ? Replace carpet with wood, tile, or vinyl flooring. Carpet can trap dander and dust. ? Do not smoke. Do not allow smoking in your home. ? Change your heating and air conditioning filter at least once a month. ? During allergy season:  Keep windows closed as much as possible.  Plan outdoor activities when pollen counts are lowest. This is usually during the evening hours.  When coming indoors, change clothing and shower before sitting on furniture or bedding.  Take over-the-counter and prescription medicines only as told by your health care provider.  Keep all follow-up visits as told by your health care provider. This is important. Contact a health care provider if:  You have a fever.  You develop a persistent cough.  You make whistling sounds when you breathe (you wheeze).  Your symptoms interfere with your normal daily activities. Get help right away if:  You have shortness of breath. Summary  This condition can be managed by taking medicines as directed and avoiding allergens.  Contact your health care provider if you develop a persistent cough or fever.  During allergy season, keep windows  closed as much as possible. This information is not intended to replace advice given to you by your health care provider. Make sure you discuss any questions you have with your health care provider. Document Released: 10/30/2000 Document Revised: 03/14/2016 Document Reviewed: 03/14/2016 Elsevier Interactive  Patient Education  [employer].

## 2017-11-26 NOTE — Progress Notes (Signed)
Patient states he has had congestion and cough for 3 days. The cough is mainly productive first thing in the morning but takes effort to be come productive.  Subjective.    Patient states he has not felt well for the last 3 days.  He has had a burning sensation in his throat.  He has not had a fever.  He has a sore throat.  This is a burning sensation.  He has a mild cough.  This is been productive of scant amounts of yellowish phlegm.  He is not a smoker and does not vape.  He has not been taking any medication because of his high blood pressure.  Objective.    Alert cooperative no distress. Nose exam reveals turbinates to be blue and swollen. TMs reveal serous otitis media of the right ear. Throat exam reveals no tonsils to be present there is mild redness. Neck supple without adenopathy. Chest clear to auscultation percussion. Assessment    Patient seen with possible upper respiratory infection but more likely allergic trigger.  He works at the land[employer] and is exposed to large quantities of dust and had similar problems last year.  Blood pressure was up and he will monitor this. Plan.    Advised to take Zyrtec 1 a day. Prescription sent for Tessalon Perles 1-2 3 times a day. Astepro 1 to 2 sprays each nares twice a day. He will use nasal spray twice a day. Strep screen checked was negative. Blood pressure 140/88 and on repeat 132/98.  He will monitor this and continue his current medications.

## 2017-12-09 ENCOUNTER — Telehealth: Payer: Self-pay

## 2017-12-09 NOTE — Telephone Encounter (Signed)
  Copied from CRM [id number]. Topic: General - Other >> Dec 09, 2017  1:47 PM Gerrianne Scale wrote: Reason for CRM: pt calling to find out his blood type please give pt a call back at [phone]

## 2017-12-09 NOTE — Telephone Encounter (Signed)
Do we do labwork to determine blood type  Patient is wanting to give blood ?

## 2017-12-09 NOTE — Telephone Encounter (Signed)
There is a lab to determine blood type, but I am not sure if it will be covered by insurance if he just wants to know the type and there is no medical reason for why we need to know.  Does he need to know for particular reason?  Either way I am happy to order for patient

## 2017-12-10 NOTE — Telephone Encounter (Signed)
Called Pt to tell him we do not just test for blood type, unless there is a specific medical reason. I told him to try the [employer]. Pt stated okay and he would try there.

## 2017-12-24 ENCOUNTER — Ambulatory Visit: Payer: Managed Care, Other (non HMO) | Admitting: Family Medicine

## 2017-12-24 ENCOUNTER — Encounter: Payer: Self-pay | Admitting: Family Medicine

## 2017-12-24 ENCOUNTER — Other Ambulatory Visit: Payer: Self-pay | Admitting: Family Medicine

## 2017-12-24 VITALS — BP 138/96 | HR 54 | Temp 98.2°F | Ht 66.0 in | Wt 176.0 lb

## 2017-12-24 DIAGNOSIS — W57XXXA Bitten or stung by nonvenomous insect and other nonvenomous arthropods, initial encounter: Secondary | ICD-10-CM

## 2017-12-24 DIAGNOSIS — Z91018 Allergy to other foods: Secondary | ICD-10-CM

## 2017-12-24 DIAGNOSIS — F32A Depression, unspecified: Secondary | ICD-10-CM

## 2017-12-24 DIAGNOSIS — E781 Pure hyperglyceridemia: Secondary | ICD-10-CM

## 2017-12-24 DIAGNOSIS — T148XXA Other injury of unspecified body region, initial encounter: Secondary | ICD-10-CM | POA: Diagnosis not present

## 2017-12-24 DIAGNOSIS — F329 Major depressive disorder, single episode, unspecified: Secondary | ICD-10-CM

## 2017-12-24 DIAGNOSIS — A770 Spotted fever due to Rickettsia rickettsii: Secondary | ICD-10-CM

## 2017-12-24 DIAGNOSIS — F419 Anxiety disorder, unspecified: Secondary | ICD-10-CM

## 2017-12-24 HISTORY — DX: Pure hyperglyceridemia: E78.1

## 2017-12-24 HISTORY — DX: Depression, unspecified: F32.A

## 2017-12-24 HISTORY — DX: Bitten or stung by nonvenomous insect and other nonvenomous arthropods, initial encounter: W57.XXXA

## 2017-12-24 MED ORDER — ESCITALOPRAM OXALATE 10 MG PO TABS: 10.0000 mg | ORAL_TABLET | Freq: Every day | ORAL | 2 refills | Status: DC

## 2017-12-24 NOTE — Progress Notes (Signed)
Subjective:    Patient ID: Nathan Fox, male    DOB: November 06, 1965, 52 y.o.   MRN: [id number]  HPI  Patient presents to clinic due to anxiety.  Patient has noticed over the past few months his anxiety is gotten worse, seems more jumpy and nervous all the time.  Patient has had issues with anxiety and panic attacks in the past but is always worked his way through it.  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  States she lives with them and this has changed his life dramatically.  Days in the past he and wife could just pick up and go to the beach or go on a vacation, but now they have to make plans to either bring the mother-in-law with him or set up a caregiver to be with her.  Patient states this is added a lot of stress to his life, feels like he is unable to live his life the way he wants due to having these obligations at home.  Denies any suicidal homicidal ideation.  States he usually is able to help himself calm down somewhat by taking deep breaths.  Patient also reports being very nervous about possible tickborne illnesses.  Patient states a few of his friends now have the alpha gal allergies after being bit by ticks.  Patient states he has been bit by ticks a few times in the past, this was years ago.  States he never developed any bull's-eye rash or other joint pain type symptoms.  Patient is concerned that he might be allergic to red meat, also is worried about Lyme and [facility] spotted fever.  Patient states she has avoided red meat the past 3 months due to his anxiety worsening and being concerned about having the alpha gal allergy.  Patient also is fearful of starting any medication for cholesterol.  Patient believes this fear of starting new medications or having these different allergies are related to his worsening anxiety.  Patient Active Problem List   Diagnosis Date Noted   . Abdominal pain, right upper quadrant   . Intestinal metaplasia of gastric mucosa   . Stomach irritation   . Columnar epithelial-lined lower esophagus   . Change in bowel habits   . Second degree hemorrhoids   . Gastric pain   . Gastritis   . Acid reflux 08/30/2014  . BP (high blood pressure) 08/30/2014   Social History   Tobacco Use  . Smoking status: Never Smoker  . Smokeless tobacco: Former Neurosurgeon    Types: Snuff  Substance Use Topics  . Alcohol use: Yes    Alcohol/week: 0.0 standard drinks    Comment: occas.    Review of Systems  Constitutional: Negative for chills, fatigue and fever.  HENT: Negative for congestion, ear pain, sinus pain and sore throat.   Eyes: Negative.   Respiratory: Negative for cough, shortness of breath and wheezing.   Cardiovascular: Negative for chest pain, palpitations and leg swelling.  Gastrointestinal: Negative for abdominal pain, diarrhea, nausea and vomiting.  Genitourinary: Negative for dysuria, frequency and urgency.  Musculoskeletal: Negative for arthralgias and myalgias.  Skin: Negative for color change, pallor and rash.  Neurological: Negative for syncope, light-headedness and headaches.  Psychiatric/Behavioral: The patient is anxious.       Objective:   Physical Exam  Constitutional: He is oriented to person, place, and time. He appears well-developed and well-nourished.  No distress. Well groomed.  Head: Normocephalic and atraumatic.  Eyes: Pupils are equal, round, and reactive to light. Conjunctivae and EOM are normal. No scleral icterus.  Neck: Normal range of motion. Neck supple. No tracheal deviation present.  Cardiovascular: Normal rate, regular rhythm and normal heart sounds.  Pulmonary/Chest: Effort normal and breath sounds normal. No respiratory distress. He has no wheezes. He has no rales.   Neurological: He is alert and oriented to person, place, and time. Gait normal  Skin: Skin is warm and dry. He is not diaphoretic.  No pallor.  Psychiatric: He has a normal mood and affect. His behavior is normal. Thought content normal. No SI or HI.    Nursing note and vitals reviewed.     Vitals:   12/24/17 1049  BP: (!) 138/96  Pulse: (!) 54  Temp: 98.2 F (36.8 C)  SpO2: 98%   Assessment & Plan:   Anxiety-patient agreeable to start Lexapro 10 mg once daily.  Patient reassured that he does have added stress in his life, and being a caregiver can weigh heavily on someone.  Patient is trying to get back out and do more fishing and hunting with friends.  Declines offer for referral to counseling at this time.  Tick bite-patient has history of a few tick bites years ago.  Patient is concerned he possibly has some tickborne illness versus, we will do lab work to further evaluate.  Patient currently has no symptoms of any tickborne illness, but he states he is very anxious that he could possibly have these.  Elevated triglycerides- patient states he is somewhat fearful of starting a cholesterol medication, we will plan to review lab work for cholesterol levels at next visit and if they continue to remain high patient at that time states he will consider starting a medicine to help control triglyceride levels.  Patient will follow-up in 4 weeks for recheck on mood after starting Lexapro.  We will also plan to recheck cholesterol level at that time.

## 2017-12-26 ENCOUNTER — Other Ambulatory Visit: Payer: Self-pay | Admitting: Emergency Medicine

## 2017-12-27 ENCOUNTER — Other Ambulatory Visit: Payer: Self-pay | Admitting: Emergency Medicine

## 2017-12-28 NOTE — Telephone Encounter (Signed)
Sent to incorrect practice Forwarded to [facility]

## 2017-12-30 LAB — ALPHA-GAL PANEL
Beef IgE: <0.1 kU/L (ref <0.35)
CLASS: 0
CLASS: 0
Class: 0
Galactose-alpha-1,3-galactose IgE: 0.19 kU/L — ABNORMAL HIGH (ref <0.10)
LAMB/MUTTON IGE: <0.1 kU/L (ref <0.35)
Pork IgE: <0.1 kU/L (ref <0.35)

## 2017-12-30 LAB — ROCKY MTN SPOTTED FVR ABS PNL(IGG+IGM)
RMSF IGG: DETECTED — AB
RMSF IgM: NOT DETECTED

## 2017-12-30 LAB — B. BURGDORFI ANTIBODIES: B burgdorferi Ab IgG+IgM: <0.9 index

## 2017-12-30 LAB — REFLEX RMSF IGG TITER: RMSF IgG Titer: 1:64 {titer} — ABNORMAL HIGH

## 2017-12-31 ENCOUNTER — Telehealth: Payer: Self-pay | Admitting: Lab

## 2017-12-31 ENCOUNTER — Telehealth: Payer: Self-pay | Admitting: Family Medicine

## 2017-12-31 DIAGNOSIS — T7840XA Allergy, unspecified, initial encounter: Secondary | ICD-10-CM

## 2017-12-31 MED ORDER — DOXYCYCLINE HYCLATE 100 MG PO TABS: 100.0000 mg | ORAL_TABLET | Freq: Two times a day (BID) | ORAL | 0 refills | Status: DC

## 2017-12-31 MED ORDER — EPINEPHRINE 0.3 MG/0.3ML IJ SOAJ: 0.3000 mg | Freq: Once | INTRAMUSCULAR | 2 refills | Status: AC

## 2017-12-31 NOTE — Addendum Note (Signed)
Addended by: Leanora CoverGUSE, Anniya Whiters on: 12/31/2017 07:16 AM   Modules accepted: Orders

## 2017-12-31 NOTE — Addendum Note (Signed)
Addended by: Leanora CoverGUSE, Camren Lipsett on: 12/31/2017 12:17 PM   Modules accepted: Orders

## 2017-12-31 NOTE — Telephone Encounter (Signed)
Called Pt with results, Pt stated he understood and that he had an idea about the meat, that was wrong with him. I told him about the Rx sent to the pharmacy for him for the [facility]. Also he wanted to know if he can have a new Epi pen Rx sent to the pharmacy.

## 2017-12-31 NOTE — Telephone Encounter (Signed)
I have spoken with patient see lab result notes.

## 2017-12-31 NOTE — Telephone Encounter (Signed)
Copied from CRM [id number]. Topic: Quick Communication - See Telephone Encounter >> Dec 31, 2017  9:41 AM Trula SladeWalter, Linda F wrote: CRM for notification. See Telephone encounter for: 12/31/17. Patient's labs were released to him through MyChart but he had some questions about them.  He would like a return call from a nurse to discuss his recent labs.

## 2017-12-31 NOTE — Addendum Note (Signed)
Addended by: Leanora CoverGUSE, Anoop Hemmer on: 12/31/2017 12:15 PM   Modules accepted: Orders

## 2018-01-07 ENCOUNTER — Ambulatory Visit: Payer: Self-pay

## 2018-01-07 NOTE — Telephone Encounter (Signed)
Patient called, left VM to return call to speak to a TN about the redness to face while taking the antibiotic. Patient was started on [facility]cycline [facility] exposure and Lexapro for Anxiety.

## 2018-01-07 NOTE — Telephone Encounter (Signed)
Patient called in with c/o "redness to face." He says "I don't know if this is a medication reaction, but last night I noticed my face after taking the medicines she prescribed. I was slightly swimmy headed last night while bowling, but not real dizzy. I didn't know if this was the medicine. I work outside, so didn't know if this is causing the redness or not." I asked about other symptoms, he denies. I advised the medications are Lexapro and Doxycycline. I advised that this note will be sent over to Leanora CoverLauren Guse, FNP and someone will call back with her recommendation, he verbalized understanding.   Reason for Disposition . Caller has NON-URGENT medication question about med that PCP prescribed and triager unable to answer question  Answer Assessment - Initial Assessment Questions 1. SYMPTOMS: "Do you have any symptoms?"     Redness to my face 2. SEVERITY: If symptoms are present, ask "Are they mild, moderate or severe?"     Mild  Protocols used: MEDICATION QUESTION CALL-A-AH

## 2018-01-07 NOTE — Telephone Encounter (Signed)
I spoke with patient & he is feeling better, but still has redness of his face. He said dizziness had subsided from last night. I advised since Nathan Fox was not here that he go to ER or urgent care, especially if symptoms worsen. We did not know if this was some type of allergic reaction.FYI.  He stated that he would do so.

## 2018-01-08 ENCOUNTER — Encounter: Payer: Self-pay | Admitting: Allergy and Immunology

## 2018-01-08 ENCOUNTER — Ambulatory Visit: Payer: Managed Care, Other (non HMO) | Admitting: Allergy and Immunology

## 2018-01-08 VITALS — BP 130/82 | HR 72 | Temp 98.4°F | Resp 18 | Ht 65.6 in | Wt 171.0 lb

## 2018-01-08 DIAGNOSIS — J3089 Other allergic rhinitis: Secondary | ICD-10-CM

## 2018-01-08 DIAGNOSIS — Z79899 Other long term (current) drug therapy: Secondary | ICD-10-CM

## 2018-01-08 DIAGNOSIS — T7800XD Anaphylactic reaction due to unspecified food, subsequent encounter: Secondary | ICD-10-CM | POA: Diagnosis not present

## 2018-01-08 DIAGNOSIS — H833X3 Noise effects on inner ear, bilateral: Secondary | ICD-10-CM | POA: Diagnosis not present

## 2018-01-08 NOTE — Telephone Encounter (Signed)
OK great thanks

## 2018-01-08 NOTE — Progress Notes (Signed)
Dear Nathan Fox,  Thank you for referring Nathan Fox to the [facility] [facility] on 01/08/2018.   Below is a summation of this patient's evaluation and recommendations.  Thank you for your referral. I will keep you informed about this patient's response to treatment.   If you have any questions please do not hesitate to contact me.   Sincerely,  Nathan Priest, MD Allergy / Immunology  [facility]   ______________________________________________________________________    NEW PATIENT NOTE  Referring Provider: Tracey Harries, FNP Primary Provider: Tracey Harries, FNP Date of office visit: 01/08/2018    Subjective:   Chief Complaint:  Nathan Fox (DOB: 09-Sep-1965) is a 52 y.o. male who presents to the clinic on 01/08/2018 with a chief complaint of Other (Positive Alpha Gal testing) .     HPI: Nathan Fox presents to this clinic in evaluation of possible food allergy.  Approximately 2 years ago he had noticed that when he ate mammal he would wake up late at night around 2 AM in the morning with uncontrollable diarrhea and shaking.  Over the course of the past year or so he has basically eliminated mammal other than some occasional bacon consumption and he did eat deer this summer without any difficulty.  He has recently had blood tests which have identified IgE antibodies directed against alpha gal.  He does have a history of other atopic disease with stuffy nose and sneezing for which he will take an antihistamine which works relatively well regarding this issue.  He has developed a large local reaction to wasp stings but has never had any associated systemic or constitutional symptoms.   Past Medical History:  Diagnosis Date  . Chicken pox   . Depression   . GERD (gastroesophageal reflux disease)   . Hypertension   . PTSD (post-traumatic stress disorder)     Past Surgical History:    Procedure Laterality Date  . COLONOSCOPY WITH PROPOFOL N/A 09/23/2014   Procedure: COLONOSCOPY WITH PROPOFOL;  Surgeon: Midge Minium, MD;  Location: [facility];  Service: Endoscopy;  Laterality: N/A;  . ESOPHAGOGASTRODUODENOSCOPY (EGD) WITH PROPOFOL N/A 09/23/2014   Procedure: ESOPHAGOGASTRODUODENOSCOPY (EGD) WITH PROPOFOL;  Surgeon: Midge Minium, MD;  Location: [facility];  Service: Endoscopy;  Laterality: N/A;  gastric biopsy  . ESOPHAGOGASTRODUODENOSCOPY (EGD) WITH PROPOFOL N/A 05/07/2017   Procedure: ESOPHAGOGASTRODUODENOSCOPY (EGD) WITH PROPOFOL;  Surgeon: Pasty Spillers, MD;  Location: [facility];  Service: Endoscopy;  Laterality: N/A;  . HERNIA REPAIR     x 2   . TONSILLECTOMY    . WRIST SURGERY Left    cyst    Allergies as of 01/08/2018      Reactions   Bee Venom Swelling      Medication List      EPINEPHrine 0.3 mg/0.3 mL Soaj injection Commonly known as:  EPI-PEN   escitalopram 10 MG tablet Commonly known as:  LEXAPRO Take 1 tablet (10 mg total) by mouth daily.   lisinopril 10 MG tablet Commonly known as:  PRINIVIL,ZESTRIL Take 1 tablet (10 mg total) by mouth daily.   omeprazole 20 MG capsule Commonly known as:  PRILOSEC Take 1 capsule (20 mg total) by mouth daily.       Review of systems negative except as noted in HPI / PMHx or noted below:  Review of Systems  Constitutional: Negative.   HENT:  Has had hearing loss most of his life.  Runs a bulldozer at work  Eyes: Negative.   Respiratory: Negative.   Cardiovascular: Negative.   Gastrointestinal: Negative.   Genitourinary: Negative.   Musculoskeletal: Negative.   Skin: Negative.   Neurological: Negative.   Endo/Heme/Allergies: Negative.   Psychiatric/Behavioral: Negative.     Family History  Problem Relation Age of Onset  . Hypertension Mother   . Stroke Mother   . Arthritis Mother   . Asthma Mother   . COPD Mother   . Depression Mother   . Hyperlipidemia Mother    . Asthma Maternal Grandmother   . Hearing loss Maternal Grandmother   . Heart attack Maternal Grandmother   . Early death Maternal Grandmother   . Alcohol abuse Maternal Grandfather   . Asthma Maternal Grandfather   . Early death Maternal Grandfather   . Hearing loss Maternal Grandfather     Social History   Socioeconomic History  . Marital status: Married    Spouse name: Not on file  . Number of children: Not on file  . Years of education: Not on file  . Highest education level: Not on file  Occupational History  . Not on file  Social Needs  . Financial resource strain: Not on file  . Food insecurity:    Worry: Not on file    Inability: Not on file  . Transportation needs:    Medical: Not on file    Non-medical: Not on file  Tobacco Use  . Smoking status: Never Smoker  . Smokeless tobacco: Current User    Types: Chew  Substance and Sexual Activity  . Alcohol use: Yes    Alcohol/week: 0.0 standard drinks    Comment: occas.  . Drug use: No  . Sexual activity: Yes  Lifestyle  . Physical activity:    Days per week: Not on file    Minutes per session: Not on file  . Stress: Not on file  Relationships  . Social connections:    Talks on phone: Not on file    Gets together: Not on file    Attends religious service: Not on file    Active member of club or organization: Not on file    Attends meetings of clubs or organizations: Not on file    Relationship status: Not on file  . Intimate partner violence:    Fear of current or ex partner: Not on file    Emotionally abused: Not on file    Physically abused: Not on file    Forced sexual activity: Not on file  Other Topics Concern  . Not on file  Social History Narrative  . Not on file    Environmental and Social history  Lives in a house with a dry environment, dogs located inside the household, no carpet in the bedroom, plastic on the bed, no plastic on the pillow, no smokers located inside the household.  He  is a heavy Arboriculturist.  Objective:   Vitals:   01/08/18 0944  BP: 130/82  Pulse: 72  Resp: 18  Temp: 98.4 F (36.9 C)   Height: 5' 5.6" (166.6 cm) Weight: 171 lb (77.6 kg)  Physical Exam  Constitutional:  Hard of hearing  HENT:  Head: Normocephalic. Head is without right periorbital erythema and without left periorbital erythema.  Right Ear: Tympanic membrane, external ear and ear canal normal.  Left Ear: Tympanic membrane, external ear and ear canal normal.  Nose: Nose normal. No mucosal  edema or rhinorrhea.  Mouth/Throat: Oropharynx is clear and moist and mucous membranes are normal. No oropharyngeal exudate.  Eyes: Pupils are equal, round, and reactive to light. Conjunctivae and lids are normal.  Neck: Trachea normal. No tracheal deviation present. No thyromegaly present.  Cardiovascular: Normal rate, regular rhythm, S1 normal, S2 normal and normal heart sounds.  No murmur heard. Pulmonary/Chest: Effort normal. No stridor. No respiratory distress. He has no wheezes. He has no rales. He exhibits no tenderness.  Abdominal: Soft. He exhibits no distension and no mass. There is no hepatosplenomegaly. There is no tenderness. There is no rebound and no guarding.  Musculoskeletal: He exhibits no edema or tenderness.  Lymphadenopathy:       Head (right side): No tonsillar adenopathy present.       Head (left side): No tonsillar adenopathy present.    He has no cervical adenopathy.    He has no axillary adenopathy.  Neurological: He is alert.  Skin: No rash noted. He is not diaphoretic. No erythema. No pallor. Nails show no clubbing.    Diagnostics: Allergy skin tests were performed.  He did not demonstrate any hypersensitivity against a screening panel of foods.  He did demonstrate hypersensitivity to house dust mite  Assessment and Plan:    1. Anaphylactic shock due to food, subsequent encounter   2. Perennial allergic rhinitis   3. Noise-induced hearing loss of  both ears   4. On angiotensin-converting enzyme (ACE) inhibitors     1.  Allergen avoidance measures - mammal, house dust mite  2.  Auvi-Q 0.3, Benadryl, MD/ER evaluation for allergic reaction  3.  Continue OTC antihistamine if needed  4.  Further evaluation and treatment?  5.  Obtain a fall flu vaccine every year  6.  Return to clinic in 1 year or earlier if problem  7.  Obtain hearing protection while at work  8.  Lisinopril use?  It does appear as though Bethann BerkshireJohnny has an atopic immune system and we will get him to avoid mammal and house dust mite avoidance measures at this point in time and he has the option of utilizing over-the-counter antihistamine for his atopic respiratory disease and of course injectable epinephrine should he develop a allergic reaction with inadvertent administration of mammal.  He is on lisinopril and for individuals who have a allergic sensitivity and are at risk of anaphylaxis such as in the setting of alpha gal syndrome they will have a increased risk of failed epinephrine response with the use of a ACE inhibitor.  I will pass on the recommendation that he consider switching to a different form of antihypertensive medication and remaining away from ACE inhibitors and beta-blockers as long as he appears to have alpha gal syndrome.  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  Nathan PriestEric J. Kozlow, MD Allergy / Immunology [facility]

## 2018-01-08 NOTE — Telephone Encounter (Signed)
Called Pt to see if he wanted to come in to be seen tomorrow, Pt stated he went to [facility] and the doctor that saw him stated he was having a side affect from the Doxycylene, and gave the Pt a different medication. Pt said he feels better. He stated he doesn't need to come in tomorrow.

## 2018-01-08 NOTE — Patient Instructions (Addendum)
  1.  Allergen avoidance measures - mammal, house dust mite  2.  Auvi-Q 0.3, Benadryl, MD/ER evaluation for allergic reaction  3.  Continue OTC antihistamine if needed  4.  Further evaluation and treatment?  5.  Obtain a fall flu vaccine every year  6.  Return to clinic in 1 year or earlier if problem  7.  Obtain hearing protection while at work  8.  Lisinopril use?

## 2018-01-08 NOTE — Telephone Encounter (Signed)
Please call to follow up on how he is from yesterday - He can come in tomorrow for a visit if needed

## 2018-01-08 NOTE — Telephone Encounter (Signed)
This message was in my inbox yesterday evening from Nathan KluverSarah Fox  I spoke with patient & he is feeling better, but still has redness of his face. He said dizziness had subsided from last night. I advised since Leotis ShamesLauren was not here that he go to ER or urgent care, especially if symptoms worsen. We did not know if this was some type of allergic reaction.FYI.  He stated that he would do so.

## 2018-01-12 ENCOUNTER — Encounter: Payer: Self-pay | Admitting: Allergy and Immunology

## 2018-01-14 ENCOUNTER — Ambulatory Visit: Payer: Self-pay | Admitting: *Deleted

## 2018-01-14 ENCOUNTER — Telehealth: Payer: Self-pay

## 2018-01-14 DIAGNOSIS — F419 Anxiety disorder, unspecified: Principal | ICD-10-CM

## 2018-01-14 DIAGNOSIS — F32A Depression, unspecified: Secondary | ICD-10-CM

## 2018-01-14 DIAGNOSIS — F329 Major depressive disorder, single episode, unspecified: Secondary | ICD-10-CM

## 2018-01-14 MED ORDER — ALPRAZOLAM 0.25 MG PO TABS: 0.2500 mg | ORAL_TABLET | Freq: Two times a day (BID) | ORAL | 0 refills | Status: DC | PRN

## 2018-01-14 NOTE — Telephone Encounter (Signed)
Called Pt no answer left a message. I was calling to tell the Pt that NP Leanora CoverLauren Guse stated that she will send in a small supply of xanax, but this WILL NOT be a prescription he will take for long term, she also stated He can try taking 5 mg lexapro (1/2 tab). Okay for Pes to speak to Pt.

## 2018-01-14 NOTE — Telephone Encounter (Signed)
Patient phoned with concerns of dry mouth and dry throat since he started taking Lexapro. 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 Reports his anxiety lever has increased since the visit with the Allergist, Dr. Lucie LeatherKozlow. He was prescribed an EpiPen and is monitoring his meat intake. Denies SOB/tongue swelling/CP/clearing throat. Reports he discussed anxiety at LOV  12/24/17 with L.Guse, NP.  Advice care for dry mouth and throat reviewed with patient.  He feels he needs something for his anxiety at this time. In years past he has taken xanax as needed for his nerves. He is requesting a short term of anti-anxiety medication at this time, please.  Pharmacy on file.   Reason for Disposition . Caller requesting a NON-URGENT new prescription or refill and triager unable to refill per unit policy

## 2018-01-14 NOTE — Telephone Encounter (Signed)
Error , see other encounter   Copied from CRM [phone]. Topic: Quick Conservator, museum/galleryCommunication - Office Called Patient (Clinic Use ONLY) >> Jan 14, 2018  1:32 PM Clearnce SorrelPickard, Jill S, [place] wrote: Reason for CRM: Called Pt no answer left a message. I was calling to tell the Pt that NP Leanora CoverLauren Guse stated that she will send in a small supply of xanax, but this WILL NOT be a prescription he will take for long term, she also stated He can try taking 5 mg lexapro (1/2 tab). Okay for Pes to speak to Pt. >> Jan 14, 2018  1:43 PM Jolayne Hainesaylor, Brittany L wrote: Patient called back and is aware of below message. He will pick the medication up. Will call back if he has any problems on this medication

## 2018-01-14 NOTE — Telephone Encounter (Signed)
He can try taking 5 mg lexapro (1/2 tab)  I will send in small supply of xanax, but this WILL NOT be a prescription he will take for long term

## 2018-01-14 NOTE — Telephone Encounter (Signed)
Copied from CRM [id number]. Topic: Quick Conservator, museum/galleryCommunication - Office Called Patient (Clinic Use ONLY) >> Jan 14, 2018  1:32 PM Clearnce SorrelPickard, Jill S, [place] wrote: Reason for CRM: Called Pt no answer left a message. I was calling to tell the Pt that NP Leanora CoverLauren Guse stated that she will send in a small supply of xanax, but this WILL NOT be a prescription he will take for long term, she also stated He can try taking 5 mg lexapro (1/2 tab). Okay for Pes to speak to Pt. >> Jan 14, 2018  1:43 PM Jolayne Hainesaylor, Brittany L wrote: Patient called back and is aware of below message. He will pick the medication up. Will call back if he has any problems on this medication

## 2018-01-14 NOTE — Addendum Note (Signed)
Addended by: Leanora CoverGUSE, Angelene Rome on: 01/14/2018 12:16 PM   Modules accepted: Orders

## 2018-01-14 NOTE — Telephone Encounter (Signed)
Patient phoned with concerns of dry mouth and dry throat since he started taking Lexapro. 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 Reports his anxiety lever has increased since the visit with the Allergist, Dr. Lucie LeatherKozlow. He was prescribed an EpiPen and is monitoring his meat intake. Denies SOB/tongue swelling/CP/clearing throat. Reports he discussed anxiety at LOV  12/24/17 with L.Guse, NP.  Advice care for dry mouth and throat reviewed with patient.  He feels he needs something for his anxiety at this time. In years past he has taken xanax as needed for his nerves. He is requesting a short term of anti-anxiety medication at this time, please.  Pharmacy on file.   Reason for Disposition . Caller requesting a NON-URGENT new prescription or refill and triager unable to refill per unit policy  Answer Assessment - Initial Assessment Questions 1. SYMPTOMS: "Do you have any symptoms?"     Dry mouth related to Lexapro he started taking a couple of weeks ago. 2. SEVERITY: If symptoms are present, ask "Are they mild, moderate or severe?"     Mild to moderate?  Protocols used: MEDICATION QUESTION CALL-A-AH

## 2018-01-21 ENCOUNTER — Encounter: Payer: Self-pay | Admitting: Family Medicine

## 2018-01-21 ENCOUNTER — Ambulatory Visit: Payer: Managed Care, Other (non HMO) | Admitting: Family Medicine

## 2018-01-21 VITALS — BP 132/80 | HR 70 | Temp 97.8°F | Ht 66.0 in | Wt 168.0 lb

## 2018-01-21 DIAGNOSIS — Z91018 Allergy to other foods: Secondary | ICD-10-CM

## 2018-01-21 DIAGNOSIS — F419 Anxiety disorder, unspecified: Secondary | ICD-10-CM

## 2018-01-21 DIAGNOSIS — F329 Major depressive disorder, single episode, unspecified: Secondary | ICD-10-CM

## 2018-01-21 MED ORDER — ESCITALOPRAM OXALATE 5 MG PO TABS: 5.0000 mg | ORAL_TABLET | Freq: Every day | ORAL | 1 refills | Status: DC

## 2018-01-21 NOTE — Progress Notes (Signed)
Subjective:    Patient ID: Nathan Fox, male    DOB: Nov 12, 1965, 52 y.o.   MRN: [id number]  HPI  Presents to clinic for follow up on anxiety since starting lexapro 10 mg per day and to discuss alpha gal results.   Patient states he stopped taking the Lexapro a few days ago, thought it was giving him a dry mouth.  Patient states he did feel somewhat less anxious while taking, but he thinks that getting the result back that he possibly could be sensitive to the alpha gal made him very nervous.  Patient did see the allergist and multiple allergy tests were done.  Patient states he was only shown to be allergic to dust.  I reviewed patient's alpha gal results with him again, and reassured him that his result was very minorly elevated.  Patient continues to have anxiety and stress in his life.  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  Patient Active Problem List   Diagnosis Date Noted  . High triglycerides 12/24/2017  . Anxiety and depression 12/24/2017  . Tick bite 12/24/2017  . Abdominal pain, right upper quadrant   . Intestinal metaplasia of gastric mucosa   . Stomach irritation   . Columnar epithelial-lined lower esophagus   . Change in bowel habits   . Second degree hemorrhoids   . Gastric pain   . Gastritis   . Acid reflux 08/30/2014  . BP (high blood pressure) 08/30/2014   Social History   Tobacco Use  . Smoking status: Never Smoker  . Smokeless tobacco: Current User    Types: Chew  Substance Use Topics  . Alcohol use: Yes    Alcohol/week: 0.0 standard drinks    Comment: occas.   Review of Systems   Constitutional: Negative for chills, fatigue and fever.  HENT: Negative for congestion, ear pain, sinus pain and sore throat.   Eyes: Negative.   Respiratory: Negative for cough, shortness of breath and wheezing.   Cardiovascular: Negative for chest pain, palpitations and leg swelling.  Gastrointestinal:  Negative for abdominal pain, diarrhea, nausea and vomiting.  Genitourinary: Negative for dysuria, frequency and urgency.  Musculoskeletal: Negative for arthralgias and myalgias.  Skin: Negative for color change, pallor and rash.  Neurological: Negative for syncope, light-headedness and headaches.  Psychiatric/Behavioral: The patient is nervous/anxious.       Objective:   Physical Exam   Constitutional: She appears well-developed and well-nourished. No distress.   Head: Normocephalic and atraumatic.  Eyes: Pupils are equal, round, and reactive to light. EOM are normal. No scleral icterus.  Neck: Normal range of motion. Neck supple. No tracheal deviation present.  Cardiovascular: Normal rate, regular rhythm and normal heart sounds.  Pulmonary/Chest: Effort normal and breath sounds normal. No respiratory distress. She has no wheezes. She has no rales.  Abdominal: Soft. Bowel sounds are normal. There is no tenderness.  Neurological: She is alert and oriented to person, place, and time.  Gait normal  Skin: Skin is warm and dry. No pallor.  Psychiatric: She has a normal mood and affect. Her behavior is normal. Thought content normal.  Nursing note and vitals reviewed.     Vitals:   01/21/18 0818  BP: 132/80  Pulse: 70  Temp: 97.8 F (36.6 C)  SpO2: 96%    Assessment & Plan:    A total of 25  minutes were spent face-to-face with the patient during this encounter and over half of  that time was spent on counseling and coordination of care. The patient was counseled on medications, alpha gal results.   Anxiety and depression-patient encouraged to reconsider getting back on medication to help with anxiety and depression.  After long discussion patient is agreeable for Lexapro 5 mg once daily.  Patient also was given small amount of Xanax 0.25 use as needed for breakthrough anxiety, patient advised again that this medication is not meant for long-term use and it would be better if he  stabilizes mood using a daily Lexapro rather than Xanax.  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  Patient advised that he could avoid beef, lamb and pork products for the next few months and we could recheck the alpha gal testing to see if it has normalized.  Patient states he will try this and will eat more chicken and fish rather than beef products.  Patient will follow-up here in 4 to 6 weeks for recheck on anxiety after getting back on Lexapro 5 mg once daily

## 2018-01-21 NOTE — Patient Instructions (Signed)
Sensitization to a carbohydrate allergen, galactose-alpha-1,3-galactose (alpha-gal), that is found in tissues of nonprimate mammals including cows, sheep, and pigs

## 2018-02-04 ENCOUNTER — Ambulatory Visit: Payer: Self-pay | Admitting: Emergency Medicine

## 2018-02-04 ENCOUNTER — Telehealth: Payer: Self-pay

## 2018-02-04 ENCOUNTER — Ambulatory Visit: Payer: No Typology Code available for payment source | Admitting: Allergy

## 2018-02-04 VITALS — BP 140/86 | HR 66 | Temp 98.0°F | Resp 14

## 2018-02-04 DIAGNOSIS — Z91018 Allergy to other foods: Secondary | ICD-10-CM

## 2018-02-04 DIAGNOSIS — J392 Other diseases of pharynx: Secondary | ICD-10-CM

## 2018-02-04 HISTORY — DX: Allergy to other foods: Z91.018

## 2018-02-04 LAB — POCT RAPID STREP A (OFFICE): Rapid Strep A Screen: NEGATIVE

## 2018-02-04 NOTE — Progress Notes (Signed)
Called Pt and scheduled an appt for 12/23 @4 :00pm

## 2018-02-04 NOTE — Telephone Encounter (Signed)
A referral for ENT and should contact the patient with an appointment date and time.  [facility]  [address] [place] [id number]  [phone]

## 2018-02-04 NOTE — Patient Instructions (Addendum)
1.  Continue azelastine. 2.  Stop lisinopril and call your primary care physician regarding a change in blood pressure medication. 3.  We will make you a referral to ear nose and throat for evaluation of your symptoms. 4.  If your evaluation reveals no abnormalities consider adjustment of your anxiety medications.   Globus Pharyngeus Globus pharyngeus is a condition that makes it feel like you have a lump in your throat. It may also feel like you have something stuck in the front of your throat. This feeling may come and go. It is not painful, and it does not make it harder to swallow food or liquid. Globus pharyngeus does not cause changes that a health care provider can see during a physical exam. This condition usually goes away without treatment. What are the causes? Often, no cause can be found. The most common cause of globus pharyngeus is a condition that causes stomach juices to flow back up into the throat (gastroesophageal reflux). Other possible causes include:  Overstimulation of nerves that control swallowing.  Irritation of nerves that control swallowing (neuralgia).  An enlarged gland in the lower neck (thyroid gland).  Growth of tonsil tissue at the base of the tongue (lingual tonsil).  Anxiety.  Depression. What are the signs or symptoms? The main symptom of this condition is a feeling of a lump in your throat. This feeling usually comes and goes. How is this diagnosed? This condition may be diagnosed after other conditions have been ruled out. You may have tests, such as:  A swallow study.  Ear, nose, and throat evaluation.  An exam of your throat using a thin, flexible tube with a light and camera on the end (endoscopy). How is this treated? This condition may go away on its own, without treatment. In some cases, antidepressant medicines may be helpful. Follow these instructions at home:   Follow instructions from your health care provider about eating or  drinking restrictions.  Take over-the-counter and prescription medicines only as told by your health care provider.  Keep all follow-up visits as told by your health care provider. This is important.  Follow instructions from your health care provider about home care for gastroesophageal reflux. Your health care provider may recommend that you: ? Do not eat or drink anything that causes heartburn. ? Do not eat heavy meals close to bedtime. ? Do not drink caffeine. ? Do not drink alcohol. ? Raise the head of your bed. ? Sleep on your left side. Contact a health care provider if:  Your symptoms get worse.  You have throat pain.  You have trouble swallowing.  Food or liquid comes back up into your mouth.  You lose weight without trying. Get help right away if:  You develop swelling in your throat. Summary  Globus pharyngeus is a condition that makes it feel like you have a lump in your throat.  This condition usually goes away without treatment. This information is not intended to replace advice given to you by your health care provider. Make sure you discuss any questions you have with your health care provider. Document Released: 10/11/2015 Document Revised: 10/11/2015 Document Reviewed: 10/11/2015 Elsevier Interactive Patient Education  2019 [employer]vier Inc.  Generalized Anxiety Disorder, Adult Generalized anxiety disorder (GAD) is a mental health disorder. People with this condition constantly worry about everyday events. Unlike normal anxiety, worry related to GAD is not triggered by a specific event. These worries also do not fade or get better with time. GAD interferes  with life functions, including relationships, work, and school. GAD can vary from mild to severe. People with severe GAD can have intense waves of anxiety with physical symptoms (panic attacks). What are the causes? The exact cause of GAD is not known. What increases the risk? This condition is more likely  to develop in:  Women.  People who have a family history of anxiety disorders.  People who are very shy.  People who experience very stressful life events, such as the death of a loved one.  People who have a very stressful family environment. What are the signs or symptoms? People with GAD often worry excessively about many things in their lives, such as their health and family. They may also be overly concerned about:  Doing well at work.  Being on time.  Natural disasters.  Friendships. Physical symptoms of GAD include:  Fatigue.  Muscle tension or having muscle twitches.  Trembling or feeling shaky.  Being easily startled.  Feeling like your heart is pounding or racing.  Feeling out of breath or like you cannot take a deep breath.  Having trouble falling asleep or staying asleep.  Sweating.  Nausea, diarrhea, or irritable bowel syndrome (IBS).  Headaches.  Trouble concentrating or remembering facts.  Restlessness.  Irritability. How is this diagnosed? Your health care provider can diagnose GAD based on your symptoms and medical history. You will also have a physical exam. The health care provider will ask specific questions about your symptoms, including how severe they are, when they started, and if they come and go. Your health care provider may ask you about your use of alcohol or drugs, including prescription medicines. Your health care provider may refer you to a mental health specialist for further evaluation. Your health care provider will do a thorough examination and may perform additional tests to rule out other possible causes of your symptoms. To be diagnosed with GAD, a person must have anxiety that:  Is out of his or her control.  Affects several different aspects of his or her life, such as work and relationships.  Causes distress that makes him or her unable to take part in normal activities.  Includes at least three physical symptoms of  GAD, such as restlessness, fatigue, trouble concentrating, irritability, muscle tension, or sleep problems. Before your health care provider can confirm a diagnosis of GAD, these symptoms must be present more days than they are not, and they must last for six months or longer. How is this treated? The following therapies are usually used to treat GAD:  Medicine. Antidepressant medicine is usually prescribed for long-term daily control. Antianxiety medicines may be added in severe cases, especially when panic attacks occur.  Talk therapy (psychotherapy). Certain types of talk therapy can be helpful in treating GAD by providing support, education, and guidance. Options include: ? Cognitive behavioral therapy (CBT). People learn coping skills and techniques to ease their anxiety. They learn to identify unrealistic or negative thoughts and behaviors and to replace them with positive ones. ? Acceptance and commitment therapy (ACT). This treatment teaches people how to be mindful as a way to cope with unwanted thoughts and feelings. ? Biofeedback. This process trains you to manage your body's response (physiological response) through breathing techniques and relaxation methods. You will work with a therapist while machines are used to monitor your physical symptoms.  Stress management techniques. These include yoga, meditation, and exercise. A mental health specialist can help determine which treatment is best for you. Some people  see improvement with one type of therapy. However, other people require a combination of therapies. Follow these instructions at home:  Take over-the-counter and prescription medicines only as told by your health care provider.  Try to maintain a normal routine.  Try to anticipate stressful situations and allow extra time to manage them.  Practice any stress management or self-calming techniques as taught by your health care provider.  Do not punish yourself for setbacks  or for not making progress.  Try to recognize your accomplishments, even if they are small.  Keep all follow-up visits as told by your health care provider. This is important. Contact a health care provider if:  Your symptoms do not get better.  Your symptoms get worse.  You have signs of depression, such as: ? A persistently sad, cranky, or irritable mood. ? Loss of enjoyment in activities that used to bring you joy. ? Change in weight or eating. ? Changes in sleeping habits. ? Avoiding friends or family members. ? Loss of energy for normal tasks. ? Feelings of guilt or worthlessness. Get help right away if:  You have serious thoughts about hurting yourself or others. If you ever feel like you may hurt yourself or others, or have thoughts about taking your own life, get help right away. You can go to your nearest emergency department or call:  Your local emergency services (911 in the [place]  A suicide crisis helpline, such as the National Suicide Prevention Lifeline at (424) 029-5274. This is open 24 hours a day. Summary  Generalized anxiety disorder (GAD) is a mental health disorder that involves worry that is not triggered by a specific event.  People with GAD often worry excessively about many things in their lives, such as their health and family.  GAD may cause physical symptoms such as restlessness, trouble concentrating, sleep problems, frequent sweating, nausea, diarrhea, headaches, and trembling or muscle twitching.  A mental health specialist can help determine which treatment is best for you. Some people see improvement with one type of therapy. However, other people require a combination of therapies. This information is not intended to replace advice given to you by your health care provider. Make sure you discuss any questions you have with your health care provider. Document Released: 06/01/2012 Document Revised: 12/26/2015 Document Reviewed: 12/26/2015 Elsevier  Interactive Patient Education  2019 [employer].
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
## 2018-02-04 NOTE — Progress Notes (Signed)
Please call patient to schedule follow up due to changes in his BP mades made at the urgent care

## 2018-02-05 ENCOUNTER — Telehealth: Payer: Self-pay

## 2018-02-05 LAB — STREP A DNA PROBE: Strep Gp A Direct, DNA Probe: NEGATIVE

## 2018-02-05 NOTE — Telephone Encounter (Signed)
Called patient to notify of his negative strep culture. The patient gave verbal understanding and stated that he has been using his nasal spray as prescribed and it has opened his nasal passages some. He was also notified that his referral was sent yesterday for the ENT and they should contact him shortly for Appointment info.

## 2018-02-09 ENCOUNTER — Encounter: Payer: Self-pay | Admitting: Family Medicine

## 2018-02-09 ENCOUNTER — Ambulatory Visit: Payer: Managed Care, Other (non HMO) | Admitting: Family Medicine

## 2018-02-09 VITALS — BP 132/80 | HR 84 | Temp 98.3°F | Ht 66.0 in | Wt 173.8 lb

## 2018-02-09 DIAGNOSIS — I10 Essential (primary) hypertension: Secondary | ICD-10-CM | POA: Diagnosis not present

## 2018-02-09 DIAGNOSIS — F32A Depression, unspecified: Secondary | ICD-10-CM

## 2018-02-09 DIAGNOSIS — J392 Other diseases of pharynx: Secondary | ICD-10-CM

## 2018-02-09 DIAGNOSIS — R0982 Postnasal drip: Secondary | ICD-10-CM

## 2018-02-09 DIAGNOSIS — F419 Anxiety disorder, unspecified: Secondary | ICD-10-CM | POA: Diagnosis not present

## 2018-02-09 DIAGNOSIS — F329 Major depressive disorder, single episode, unspecified: Secondary | ICD-10-CM

## 2018-02-09 HISTORY — DX: Postnasal drip: R09.82

## 2018-02-09 HISTORY — DX: Other diseases of pharynx: J39.2

## 2018-02-09 MED ORDER — ESCITALOPRAM OXALATE 5 MG PO TABS: 5.0000 mg | ORAL_TABLET | Freq: Every day | ORAL | 1 refills | Status: DC

## 2018-02-09 NOTE — Progress Notes (Signed)
Subjective:    Patient ID: Nathan Fox, male    DOB: 1965-08-01, 52 y.o.   MRN: [id number]  HPI  Presents to clinic to follow up dry throat, BP medications, sinus congestion.  Patient went to urgent care complaining of sinus congestion, left side more than right and also a dry spot in the back of his throat.  Patient states that he was prescribed a nasal spray and also given ENT referral to further investigate dry spot in throat.  Patient also was advised to stop his lisinopril due to this possibly contributing to dry spot in throat.  However, patient states he would prefer not to stop the lisinopril as he has been on this medication for 10+ years and blood pressures have been stable on this medicine.  Patient states when he does use the nasal spray prescribed by the urgent care, congestion seems better and he does not feel the dry spot in back of throat as much.  Denies fever or chills.  Denies nausea, vomiting or diarrhea.  Denies palpitations, chest pain, short of breath, leg swelling.  Patient does feel his anxiety is improved with taking Lexapro and he would like to continue taking.  Denies SI or HI.  Patient Active Problem List   Diagnosis Date Noted  . Allergy to alpha-gal 02/04/2018  . High triglycerides 12/24/2017  . Anxiety and depression 12/24/2017  . Tick bite 12/24/2017  . Abdominal pain, right upper quadrant   . Intestinal metaplasia of gastric mucosa   . Stomach irritation   . Columnar epithelial-lined lower esophagus   . Change in bowel habits   . Second degree hemorrhoids   . Gastric pain   . Gastritis   . Acid reflux 08/30/2014  . BP (high blood pressure) 08/30/2014   Social History   Tobacco Use  . Smoking status: Never Smoker  . Smokeless tobacco: Current User    Types: Chew  Substance Use Topics  . Alcohol use: Yes    Alcohol/week: 0.0 standard drinks    Comment: occas.   Review of Systems  Constitutional: Negative for chills, fatigue and  fever.  HENT: +sinus congestion, dry spot in back of throat Eyes: Negative.   Respiratory: Negative for cough, shortness of breath and wheezing.   Cardiovascular: Negative for chest pain, palpitations and leg swelling.  Gastrointestinal: Negative for abdominal pain, diarrhea, nausea and vomiting.  Genitourinary: Negative for dysuria, frequency and urgency.  Musculoskeletal: Negative for arthralgias and myalgias.  Skin: Negative for color change, pallor and rash.  Neurological: Negative for syncope, light-headedness and headaches.  Psychiatric/Behavioral: The patient is not nervous/anxious.       Objective:   Physical Exam  Constitutional: He appears well-developed and well-nourished. No distress.  HENT:  Head: Normocephalic and atraumatic.  Nose/throat: Some swelling seen in bilateral mucosal edema of nose, positive postnasal drip patterning on back of throat, some clear nasal drainage. Eyes: Conjunctivae and EOM are normal. No scleral icterus.  Neck: Normal range of motion. Neck supple. No tracheal deviation present.  Cardiovascular: Normal rate, regular rhythm and normal heart sounds.  Pulmonary/Chest: Effort normal and breath sounds normal. No respiratory distress. He has no wheezes. He has no rales.  Abdominal: Soft. Bowel sounds are normal. There is no tenderness.  Neurological: He is alert and oriented to person, place, and time.  Gait normal  Skin: Skin is warm and dry. He is not diaphoretic. No pallor.  Psychiatric: He has a normal mood and affect. His behavior is normal.  Nursing note and vitals reviewed.     Vitals:   02/09/18 1606  BP: 132/80  Pulse: 84  Temp: 98.3 F (36.8 C)  SpO2: 95%   Assessment & Plan:   Hypertension - patient's blood pressure has been well controlled on lisinopril for 10+ years.  He will continue taking lisinopril for BP control at this time.  Postnasal drip/throat dry - patient will continue nasal spray as prescribed by urgent care and  also he will trial taking Claritin 10 mg once per day to see if this helps nasal drip and dry feeling in back of throat.  He will keep ENT appointment.  Discussed with patient that if ENT recommends he stop the lisinopril due to thinking it could be contributing to the dry spot in throat, we will switch BP meds at that time.  Anxiety and depression - patient's mood is improved with taking Lexapro and he will continue taking this.  Refill Lexapro given.  Keep regularly scheduled follow-up as planned in February, patient aware he can return to clinic sooner if any issues arise.

## 2018-02-09 NOTE — Patient Instructions (Signed)
Continue lisinopril for BP control  Begin claritin 10 mg per day and continue Flonase, keep ENT appt. If ENT recommends we switch BP meds then we will  Continue lexapro to help mood

## 2018-02-12 ENCOUNTER — Ambulatory Visit: Payer: Self-pay | Admitting: Gastroenterology

## 2018-02-27 ENCOUNTER — Other Ambulatory Visit: Payer: Self-pay | Admitting: Otolaryngology

## 2018-02-27 ENCOUNTER — Other Ambulatory Visit (HOSPITAL_COMMUNITY): Payer: Self-pay | Admitting: Otolaryngology

## 2018-02-27 DIAGNOSIS — R221 Localized swelling, mass and lump, neck: Secondary | ICD-10-CM

## 2018-03-06 ENCOUNTER — Ambulatory Visit
Admission: RE | Admit: 2018-03-06 | Discharge: 2018-03-06 | Disposition: A | Discharge status: Home / Self Care | Payer: Managed Care, Other (non HMO) | Source: Ambulatory Visit | Attending: Otolaryngology | Admitting: Otolaryngology

## 2018-03-06 DIAGNOSIS — R221 Localized swelling, mass and lump, neck: Secondary | ICD-10-CM | POA: Diagnosis not present

## 2018-03-06 LAB — POCT I-STAT CREATININE: CREATININE: 1.2 mg/dL (ref 0.61–1.24)

## 2018-03-06 MED ORDER — IOPAMIDOL (ISOVUE-300) INJECTION 61%
75.0000 mL | Freq: Once | INTRAVENOUS | Status: AC | PRN
  Administered 2018-03-06: 75 mL via INTRAVENOUS

## 2018-03-10 ENCOUNTER — Ambulatory Visit: Payer: Managed Care, Other (non HMO)

## 2018-03-11 ENCOUNTER — Other Ambulatory Visit: Payer: Self-pay | Admitting: Otolaryngology

## 2018-03-11 DIAGNOSIS — R221 Localized swelling, mass and lump, neck: Secondary | ICD-10-CM

## 2018-03-13 ENCOUNTER — Other Ambulatory Visit: Payer: Self-pay | Admitting: Radiology

## 2018-03-16 ENCOUNTER — Other Ambulatory Visit: Payer: Self-pay

## 2018-03-16 ENCOUNTER — Ambulatory Visit
Admission: RE | Admit: 2018-03-16 | Discharge: 2018-03-16 | Disposition: A | Discharge status: Home / Self Care | Payer: Managed Care, Other (non HMO) | Source: Ambulatory Visit | Attending: Otolaryngology | Admitting: Otolaryngology

## 2018-03-16 ENCOUNTER — Other Ambulatory Visit: Payer: Self-pay | Admitting: Otolaryngology

## 2018-03-16 DIAGNOSIS — K219 Gastro-esophageal reflux disease without esophagitis: Secondary | ICD-10-CM | POA: Insufficient documentation

## 2018-03-16 DIAGNOSIS — F329 Major depressive disorder, single episode, unspecified: Secondary | ICD-10-CM | POA: Diagnosis not present

## 2018-03-16 DIAGNOSIS — Z888 Allergy status to other drugs, medicaments and biological substances status: Secondary | ICD-10-CM | POA: Diagnosis not present

## 2018-03-16 DIAGNOSIS — Z79899 Other long term (current) drug therapy: Secondary | ICD-10-CM | POA: Insufficient documentation

## 2018-03-16 DIAGNOSIS — Z8249 Family history of ischemic heart disease and other diseases of the circulatory system: Secondary | ICD-10-CM | POA: Insufficient documentation

## 2018-03-16 DIAGNOSIS — I10 Essential (primary) hypertension: Secondary | ICD-10-CM | POA: Diagnosis not present

## 2018-03-16 DIAGNOSIS — Z91030 Bee allergy status: Secondary | ICD-10-CM | POA: Diagnosis not present

## 2018-03-16 DIAGNOSIS — R221 Localized swelling, mass and lump, neck: Secondary | ICD-10-CM | POA: Diagnosis not present

## 2018-03-16 DIAGNOSIS — Z818 Family history of other mental and behavioral disorders: Secondary | ICD-10-CM | POA: Insufficient documentation

## 2018-03-16 DIAGNOSIS — F4310 Post-traumatic stress disorder, unspecified: Secondary | ICD-10-CM | POA: Insufficient documentation

## 2018-03-16 MED ORDER — SODIUM CHLORIDE 0.9 % IV SOLN
INTRAVENOUS | Status: DC
  Administered 2018-03-16: 09:00:00 via INTRAVENOUS

## 2018-03-16 MED ORDER — FENTANYL CITRATE (PF) 100 MCG/2ML IJ SOLN
INTRAMUSCULAR | Status: AC
  Filled 2018-03-16: qty 4

## 2018-03-16 MED ORDER — MIDAZOLAM HCL 2 MG/2ML IJ SOLN
INTRAMUSCULAR | Status: AC
  Filled 2018-03-16: qty 4

## 2018-03-16 NOTE — Procedures (Signed)
Interventional Radiology Note  No mass or lymphadenopathy identified. Both submandibular glands are normal in appearance by [place].  Jodi Marble. Fredia Sorrow, M.D Pager:  [phone]

## 2018-03-16 NOTE — H&P (Signed)
Chief Complaint: Patient was seen in consultation today for left submandibular mass biopsy at the request of Vaught,Creighton  Referring Physician(s): Vaught,Creighton  Patient Status: [facility] - Out-pt  History of Present Illness: Nathan Fox is a 52 y.o. male with a history of a palpable left submandibular mass for "years" that occasionally gets larger. CT demonstrates a roughly 2.4 cm mass inseparable from left submandibular gland representing salivary mass or adjacent lymph node. Here for biopsy of the mass. Currently asymptomatic except for some periodic nasal congestion and "stuffiness".  Past Medical History:  Diagnosis Date  . Chicken pox   . Depression   . GERD (gastroesophageal reflux disease)   . Hypertension   . PTSD (post-traumatic stress disorder)     Past Surgical History:  Procedure Laterality Date  . COLONOSCOPY WITH PROPOFOL N/A 09/23/2014   Procedure: COLONOSCOPY WITH PROPOFOL;  Surgeon: Midge Minium, MD;  Location: [facility];  Service: Endoscopy;  Laterality: N/A;  . ESOPHAGOGASTRODUODENOSCOPY (EGD) WITH PROPOFOL N/A 09/23/2014   Procedure: ESOPHAGOGASTRODUODENOSCOPY (EGD) WITH PROPOFOL;  Surgeon: Midge Minium, MD;  Location: [facility];  Service: Endoscopy;  Laterality: N/A;  gastric biopsy  . ESOPHAGOGASTRODUODENOSCOPY (EGD) WITH PROPOFOL N/A 05/07/2017   Procedure: ESOPHAGOGASTRODUODENOSCOPY (EGD) WITH PROPOFOL;  Surgeon: Pasty Spillers, MD;  Location: [facility];  Service: Endoscopy;  Laterality: N/A;  . HERNIA REPAIR     x 2   . TONSILLECTOMY    . WRIST SURGERY Left    cyst    Allergies: Alpha-gal and Bee venom  Medications: Prior to Admission medications   Medication Sig Start Date End Date Taking? Authorizing Provider  escitalopram (LEXAPRO) 5 MG tablet Take 1 tablet (5 mg total) by mouth daily. 02/09/18  Yes Guse, Janna Arch, FNP  fluticasone (FLONASE) 50 MCG/ACT nasal spray Place 1 spray into both nostrils daily. Pt  does not remember exact name of medication, thinks it is flonase   Yes [provider]  lisinopril (PRINIVIL,ZESTRIL) 10 MG tablet Take 1 tablet (10 mg total) by mouth daily. 10/02/17  Yes Guse, Janna Arch, FNP  omeprazole (PRILOSEC) 20 MG capsule Take 1 capsule (20 mg total) by mouth daily. 10/02/17  Yes Guse, Janna Arch, FNP  ALPRAZolam Prudy Feeler) 0.25 MG tablet Take 1 tablet (0.25 mg total) by mouth 2 (two) times daily as needed for anxiety. Patient not taking: Reported on 03/16/2018 01/14/18   Tracey Harries, FNP  EPINEPHrine 0.3 mg/0.3 mL IJ SOAJ injection  12/31/17   [provider]     Family History  Problem Relation Age of Onset  . Hypertension Mother   . Stroke Mother   . Arthritis Mother   . Asthma Mother   . COPD Mother   . Depression Mother   . Hyperlipidemia Mother   . Asthma Maternal Grandmother   . Hearing loss Maternal Grandmother   . Heart attack Maternal Grandmother   . Early death Maternal Grandmother   . Alcohol abuse Maternal Grandfather   . Asthma Maternal Grandfather   . Early death Maternal Grandfather   . Hearing loss Maternal Grandfather     Social History   Socioeconomic History  . Marital status: Married    Spouse name: Not on file  . Number of children: Not on file  . Years of education: Not on file  . Highest education level: Not on file  Occupational History  . Not on file  Social Needs  . Financial resource strain: Not on file  . Food insecurity:  Worry: Not on file    Inability: Not on file  . Transportation needs:    Medical: Not on file    Non-medical: Not on file  Tobacco Use  . Smoking status: Never Smoker  . Smokeless tobacco: Current User    Types: Chew  Substance and Sexual Activity  . Alcohol use: Yes    Alcohol/week: 0.0 standard drinks    Comment: occas.  . Drug use: No  . Sexual activity: Yes  Lifestyle  . Physical activity:    Days per week: Not on file    Minutes per session: Not on file  . Stress: Not  on file  Relationships  . Social connections:    Talks on phone: Not on file    Gets together: Not on file    Attends religious service: Not on file    Active member of club or organization: Not on file    Attends meetings of clubs or organizations: Not on file    Relationship status: Not on file  Other Topics Concern  . Not on file  Social History Narrative  . Not on file     Review of Systems: A 12 point ROS discussed and pertinent positives are indicated in the HPI above.  All other systems are negative.  Review of Systems  Constitutional: Negative.   HENT: Positive for congestion. Negative for facial swelling, sinus pain, sore throat and trouble swallowing.   Respiratory: Negative.  Negative for apnea.   Cardiovascular: Negative.   Gastrointestinal: Negative.   Genitourinary: Negative.   Musculoskeletal: Negative.   Neurological: Negative.     Vital Signs: BP 139/84   Pulse (!) 55   Temp 98.6 F (37 C) (Oral)   Resp 16   SpO2 99%   Physical Exam Vitals signs reviewed.  Constitutional:      General: He is not in acute distress.    Appearance: Normal appearance. He is not ill-appearing, toxic-appearing or diaphoretic.  HENT:     Head: Normocephalic and atraumatic.     Mouth/Throat:     Mouth: Mucous membranes are dry.     Pharynx: No oropharyngeal exudate or posterior oropharyngeal erythema.  Neck:     Musculoskeletal: Neck supple. No neck rigidity or muscular tenderness.     Vascular: No carotid bruit.     Comments: Palpable nodularity left submandibular region. Non-tender and non-fluctuant. Cardiovascular:     Rate and Rhythm: Normal rate and regular rhythm.     Heart sounds: Normal heart sounds. No murmur. No gallop.   Pulmonary:     Effort: No respiratory distress.     Breath sounds: Normal breath sounds. No stridor. No wheezing, rhonchi or rales.  Abdominal:     General: Abdomen is flat. Bowel sounds are normal. There is no distension.     Palpations:  Abdomen is soft. There is no mass.     Tenderness: There is no abdominal tenderness. There is no guarding or rebound.  Musculoskeletal:        General: No swelling.  Lymphadenopathy:     Cervical: No cervical adenopathy.  Skin:    General: Skin is warm and dry.  Neurological:     Mental Status: He is alert and oriented to person, place, and time.     Imaging: Ct Soft Tissue Neck W Contrast  Result Date: 03/06/2018 CLINICAL DATA:  Left submandibular mass. Intermittent sinus pain and drainage for 1 month. History of tonsillectomy. EXAM: CT NECK WITH CONTRAST TECHNIQUE: Multidetector CT imaging  of the neck was performed using the standard protocol following the bolus administration of intravenous contrast. CONTRAST:  75mL ISOVUE-300 IOPAMIDOL (ISOVUE-300) INJECTION 61% COMPARISON:  Cervical spine CT 05/14/2012 FINDINGS: Pharynx and larynx: No evidence of mass or swelling. Patent airway. Salivary glands: 2.4 x 1.6 x 2.0 cm mass inseparable from the inferior aspect of the left submandibular gland with asymmetric soft tissue present in this location on the prior cervical spine CT though poorly evaluated due to technique, field-of-view, and lack of IV contrast. Unremarkable right submandibular and bilateral parotid glands. No acute inflammation or calculi. Thyroid: Subcentimeter isodense nodule projecting posteriorly from the right thyroid lobe versus an adjacent parathyroid nodule, unchanged from 2014 favoring benignity. Lymph nodes: Aside from the left submandibular mass, no enlarged or suspicious lymph nodes are identified in the neck. Vascular: Major vascular structures of the neck are patent. Limited intracranial: Unremarkable. Visualized orbits: Unremarkable. Mastoids and visualized paranasal sinuses: Mild scattered mucosal thickening in the paranasal sinuses. Left sphenoid sinus bubbly secretions. Clear mastoid air cells. Skeleton: No suspicious osseous lesion. Mild-to-moderate cervical disc and  facet degeneration. Upper chest: Clear lung apices. Other: None. IMPRESSION: 1. 2.4 cm mass inseparable from the inferior aspect of the left submandibular gland, possibly primary salivary neoplasm or adjacent enlarged lymph node. 2. No lymphadenopathy identified elsewhere in the neck. Electronically Signed   By: Sebastian AcheAllen  Grady M.D.   On: 03/06/2018 15:23    Labs:  CBC: Recent Labs    05/08/17 1605 10/02/17 1531  WBC 5.7 5.5  HGB 14.3 15.2  HCT 41.1 44.3  PLT 173 182.0    COAGS: No results for input(s): INR, APTT in the last 8760 hours.  BMP: Recent Labs    05/08/17 1605 10/02/17 1531 03/06/18 1454  NA 145* 139  --   K 4.2 3.9  --   CL 108* 104  --   CO2 22 28  --   GLUCOSE 100* 100*  --   BUN 11 17  --   CALCIUM 8.8 9.6  --   CREATININE 1.06 1.32 1.20  GFRNONAA 80  --   --   GFRAA 93  --   --     LIVER FUNCTION TESTS: Recent Labs    05/08/17 1605 10/02/17 1531  BILITOT <0.2 0.5  AST 14 18  ALT 18 21  ALKPHOS 72 67  PROT 6.8 7.2  ALBUMIN 4.4 4.5    Assessment and Plan:  For US guided core biopsy of left submandibular mass today. Consent obtained. Risks and benefits discussed with the patient including, but not limited to bleeding, infection, damage to adjacent structures or low yield requiring additional tests. All of the patient's questions were answered, patient is agreeable to proceed. Consent signed and in chart.  Thank you for this interesting consult.  I greatly enjoyed meeting Nathan DoveJohnny L Fox and look forward to participating in their care.  A copy of this report was sent to the requesting provider on this date.  Electronically Signed: Reola CalkinsGlenn T Eithan Beagle, MD 03/16/2018, 9:19 AM     I spent a total of 15 Minutes in face to face in clinical consultation, greater than 50% of which was counseling/coordinating care for left submandibular mass biopsy.

## 2018-03-16 NOTE — Progress Notes (Signed)
Procedure cancelled, patient discharged to home per MD .

## 2018-04-06 ENCOUNTER — Ambulatory Visit: Payer: Managed Care, Other (non HMO) | Admitting: Family Medicine

## 2018-04-06 ENCOUNTER — Encounter: Payer: Self-pay | Admitting: Family Medicine

## 2018-04-06 VITALS — BP 128/78 | HR 75 | Temp 98.3°F | Resp 16 | Ht 66.0 in | Wt 173.0 lb

## 2018-04-06 DIAGNOSIS — Z125 Encounter for screening for malignant neoplasm of prostate: Secondary | ICD-10-CM

## 2018-04-06 DIAGNOSIS — R5383 Other fatigue: Secondary | ICD-10-CM | POA: Diagnosis not present

## 2018-04-06 DIAGNOSIS — F419 Anxiety disorder, unspecified: Secondary | ICD-10-CM

## 2018-04-06 DIAGNOSIS — E781 Pure hyperglyceridemia: Secondary | ICD-10-CM | POA: Diagnosis not present

## 2018-04-06 DIAGNOSIS — Z0000 Encounter for general adult medical examination without abnormal findings: Secondary | ICD-10-CM | POA: Diagnosis not present

## 2018-04-06 DIAGNOSIS — R002 Palpitations: Secondary | ICD-10-CM | POA: Diagnosis not present

## 2018-04-06 DIAGNOSIS — F329 Major depressive disorder, single episode, unspecified: Secondary | ICD-10-CM | POA: Diagnosis not present

## 2018-04-06 DIAGNOSIS — I10 Essential (primary) hypertension: Secondary | ICD-10-CM

## 2018-04-06 LAB — COMPREHENSIVE METABOLIC PANEL
ALT: 19 U/L (ref 0–53)
AST: 16 U/L (ref 0–37)
Albumin: 4.5 g/dL (ref 3.5–5.2)
Alkaline Phosphatase: 70 U/L (ref 39–117)
BUN: 17 mg/dL (ref 6–23)
CHLORIDE: 106 meq/L (ref 96–112)
CO2: 25 mEq/L (ref 19–32)
Calcium: 9 mg/dL (ref 8.4–10.5)
Creatinine, Ser: 1.13 mg/dL (ref 0.40–1.50)
GFR: 67.73 mL/min (ref >60.00)
Glucose, Bld: 98 mg/dL (ref 70–99)
Potassium: 4.1 mEq/L (ref 3.5–5.1)
Sodium: 140 mEq/L (ref 135–145)
Total Bilirubin: 0.5 mg/dL (ref 0.2–1.2)
Total Protein: 6.9 g/dL (ref 6.0–8.3)

## 2018-04-06 LAB — CBC
HEMATOCRIT: 45.8 % (ref 39.0–52.0)
Hemoglobin: 15.6 g/dL (ref 13.0–17.0)
MCHC: 34.1 g/dL (ref 30.0–36.0)
MCV: 96.3 fl (ref 78.0–100.0)
Platelets: 190 10*3/uL (ref 150.0–400.0)
RBC: 4.75 Mil/uL (ref 4.22–5.81)
RDW: 12.5 % (ref 11.5–15.5)
WBC: 4.7 10*3/uL (ref 4.0–10.5)

## 2018-04-06 LAB — LIPID PANEL
Cholesterol: 155 mg/dL (ref 0–200)
HDL: 51.1 mg/dL (ref >39.00)
LDL Cholesterol: 88 mg/dL (ref 0–99)
NonHDL: 103.7
Total CHOL/HDL Ratio: 3
Triglycerides: 78 mg/dL (ref 0.0–149.0)
VLDL: 15.6 mg/dL (ref 0.0–40.0)

## 2018-04-06 LAB — HEMOGLOBIN A1C: Hgb A1c MFr Bld: 5.4 % (ref 4.6–6.5)

## 2018-04-06 NOTE — Progress Notes (Signed)
Subjective:    Patient ID: Nathan Fox, male    DOB: Oct 19, 1965, 53 y.o.   MRN: [id number]  HPI   Patient presents in clinic for annual physical exam.  Overall he is feeling well.  He has been taking his blood pressure medication as prescribed, and his readings have been improved.  States he has noticed at times, wonders if his testosterone is too low.  Also reports he has had a few occasions of palpitations, notes it is mainly after drinking some Jim Beam whiskey (occasional), also did have an episode of palpitations that occurred minimally when he was being on the couch and resting.  Denies any chest pain denies shortness of breath and wheezing.  Denies feeling faint or dizziness.  Overall patient's mood has been stable and Lexapro.  He is still does have some issues with low energy, feeling unmotivated but attributes a lot of this to added stress in her life due to having to care for her mother-in-law who has dementia and lives with his wife.   [facility]ends [facility] [place] [facility] years.  Per year.  Trying to eat a healthy diet the cookbook active however does note a edema physically active.  Patient Active Problem List   Diagnosis Date Noted  . Post-nasal drip 02/09/2018  . Throat dry 02/09/2018  . Allergy to alpha-gal 02/04/2018  . High triglycerides 12/24/2017  . Anxiety and depression 12/24/2017  . Tick bite 12/24/2017  . Abdominal pain, right upper quadrant   . Intestinal metaplasia of gastric mucosa   . Stomach irritation   . Columnar epithelial-lined lower esophagus   . Change in bowel habits   . Second degree hemorrhoids   . Gastric pain   . Gastritis   . Acid reflux 08/30/2014  . Hypertension 08/30/2014   Social History   Tobacco Use  . Smoking status: Never Smoker  . Smokeless tobacco: Current User    Types: Chew  Substance Use Topics  . Alcohol use: Yes    Alcohol/week: 0.0 standard drinks    Comment: occas.    Review of Systems     Constitutional: Negative for chills, and fever. +fatigue HENT: Negative for congestion, ear pain, sinus pain and sore throat.   Eyes: Negative.   Respiratory: Negative for cough, shortness of breath and wheezing.   Cardiovascular: Negative for chest pain,  and leg swelling. +palpitations Gastrointestinal: Negative for abdominal pain, diarrhea, nausea and vomiting.  Genitourinary: Negative for dysuria, frequency and urgency.  Musculoskeletal: Negative for arthralgias and myalgias.  Skin: Negative for color change, pallor and rash.  Neurological: Negative for syncope, light-headedness and headaches.  Psychiatric/Behavioral: The patient is not nervous/anxious.       Objective:   Physical Exam   Constitutional: He is oriented to person, place, and time. He appears well-developed and well-nourished. No distress.  HENT:  Head: Normocephalic and atraumatic.  Eyes: Pupils are equal, round, and reactive to light. Conjunctivae and EOM are normal. No scleral icterus.  Neck: Normal range of motion. Neck supple. No tracheal deviation present.  Cardiovascular: Normal rate, regular rhythm and normal heart sounds. No LE edema. No carotid bruit.  Pulmonary/Chest: Effort normal and breath sounds normal. No respiratory distress. He has no wheezes. He has no rales.  Abdominal: Soft. Bowel sounds are normal. There is no tenderness.  GU: declines GU/prostate exam, states it has been done before. Neurological: He is alert and oriented to person, place, and time.  Gait normal  Skin:  Skin is warm and dry. He is not diaphoretic. No pallor.  Psychiatric: He has a normal mood and affect. His behavior is normal. Thought content normal.   Nursing note and vitals reviewed.  Vitals:   04/06/18 0810  BP: 128/78  Pulse: 75  Resp: 16  Temp: 98.3 F (36.8 C)  SpO2: 92%   Depression screen PHQ 2/9 04/06/2018  Decreased Interest 2  Down, Depressed, Hopeless 0  PHQ - 2 Score 2  Altered sleeping 0  Tired,  decreased energy 2  Change in appetite 0  Feeling bad or failure about yourself  1  Trouble concentrating 1  Moving slowly or fidgety/restless 0  Suicidal thoughts 0  PHQ-9 Score 6  Difficult doing work/chores Somewhat difficult       Assessment & Plan:   Well adult exam-overall patient appears to be a healthy 53 year old male.  We will get lab work today including CBC, CMP, lipid panel, thyroid panel, vitamin D, B12 thyroid/screening PSA.  Advised healthy diet and regular exercise- discussed lean proteins, lots of vegetables lots of water, avoiding excess sugar and discussed regular physical activity including walking, doing push-ups, jumping asked to keep self in good shape.  Patient will use her seatbelt when his arm.  Also discussed safe sun practices including wearing SPF of at least 30, wearing a hat, wearing long sleeves.  Hypertension -blood pressure well controlled on medications.  Will continue.  High triglycerides -patient declines were elevated at last lab work.  We will recheck today.  Palpitations - EKG performed in clinic and reviewed by me.  It is unremarkable for any acute changes, short-term.  Fatigue- lab work to rule out any anemia, electrolyte abnormalities, thyroid issues, vitamin D and B12 issues and any low testosterone.  Depression/anxiety -- mild she will begin slowly to-stable on Lexapro.  Trying to keep himself with working, doing things he enjoys like fishing and hunting.  Patient will 6 months for recheck on chronic advised to return if any issues arise.

## 2018-04-07 LAB — B12 AND FOLATE PANEL
Folate: 11.5 ng/mL (ref >5.9)
Vitamin B-12: 122 pg/mL — ABNORMAL LOW (ref 211–911)

## 2018-04-07 LAB — THYROID PANEL WITH TSH
Free Thyroxine Index: 1.8 (ref 1.4–3.8)
T3 Uptake: 34 % (ref 22–35)
T4, Total: 5.3 ug/dL (ref 4.9–10.5)
TSH: 1.23 mIU/L (ref 0.40–4.50)

## 2018-04-07 LAB — PSA, TOTAL AND FREE
PSA, % Free: 25 % (calc) — ABNORMAL LOW (ref >25)
PSA, Free: 0.1 ng/mL
PSA, Total: 0.4 ng/mL (ref <=4.0)

## 2018-04-07 LAB — VITAMIN D 25 HYDROXY (VIT D DEFICIENCY, FRACTURES): VITD: 29.7 ng/mL — ABNORMAL LOW (ref 30.00–100.00)

## 2018-04-07 LAB — TESTOSTERONE: Testosterone: 461.82 ng/dL (ref 300.00–890.00)

## 2018-04-29 ENCOUNTER — Other Ambulatory Visit: Payer: Self-pay | Admitting: Family Medicine

## 2018-04-29 DIAGNOSIS — I10 Essential (primary) hypertension: Secondary | ICD-10-CM

## 2018-04-29 MED ORDER — LISINOPRIL 10 MG PO TABS: 10.0000 mg | ORAL_TABLET | Freq: Every day | ORAL | 1 refills | Status: DC

## 2018-04-29 NOTE — Telephone Encounter (Signed)
Copied from CRM [id number]. Topic: Quick Communication - Rx Refill/Question >> Apr 29, 2018 10:21 AM Jaquita Rector A wrote: Medication: lisinopril (PRINIVIL,ZESTRIL) 10 MG tablet  Has the patient contacted their pharmacy? Yes.   (Agent: If no, request that the patient contact the pharmacy for the refill.) (Agent: If yes, when and what did the pharmacy advise?)  Preferred Pharmacy (with phone number or street name): [facility] [phone] [place], [place] - [address] AT [facility] [phone] (Phone) [phone] (Fax)    Agent: Please be advised that RX refills may take up to 3 business days. We ask that you follow-up with your pharmacy.

## 2018-05-09 ENCOUNTER — Other Ambulatory Visit: Payer: Self-pay | Admitting: Family Medicine

## 2018-05-09 DIAGNOSIS — F329 Major depressive disorder, single episode, unspecified: Secondary | ICD-10-CM

## 2018-05-09 DIAGNOSIS — F419 Anxiety disorder, unspecified: Principal | ICD-10-CM

## 2018-05-09 DIAGNOSIS — F32A Depression, unspecified: Secondary | ICD-10-CM

## 2018-05-11 MED ORDER — ALPRAZOLAM 0.25 MG PO TABS: 0.2500 mg | ORAL_TABLET | Freq: Two times a day (BID) | ORAL | 0 refills | Status: DC | PRN

## 2018-07-17 ENCOUNTER — Other Ambulatory Visit: Payer: Self-pay | Admitting: Family Medicine

## 2018-07-17 DIAGNOSIS — K219 Gastro-esophageal reflux disease without esophagitis: Secondary | ICD-10-CM

## 2018-08-04 ENCOUNTER — Other Ambulatory Visit: Payer: Self-pay | Admitting: Family Medicine

## 2018-08-04 DIAGNOSIS — F419 Anxiety disorder, unspecified: Secondary | ICD-10-CM

## 2018-08-04 DIAGNOSIS — F329 Major depressive disorder, single episode, unspecified: Secondary | ICD-10-CM

## 2018-08-05 ENCOUNTER — Other Ambulatory Visit: Payer: Self-pay | Admitting: Lab

## 2018-08-07 NOTE — Telephone Encounter (Signed)
Call pt Does pt have enough until provider is back on Tuesday next week?

## 2018-08-07 NOTE — Telephone Encounter (Signed)
Last OV 2/20 last refill 3/20 ok to fill?

## 2018-08-08 ENCOUNTER — Other Ambulatory Visit: Payer: Self-pay | Admitting: Family Medicine

## 2018-08-08 DIAGNOSIS — F419 Anxiety disorder, unspecified: Secondary | ICD-10-CM

## 2018-08-08 DIAGNOSIS — F329 Major depressive disorder, single episode, unspecified: Secondary | ICD-10-CM

## 2018-08-10 ENCOUNTER — Other Ambulatory Visit: Payer: Self-pay | Admitting: Family Medicine

## 2018-08-10 DIAGNOSIS — F419 Anxiety disorder, unspecified: Secondary | ICD-10-CM

## 2018-08-10 DIAGNOSIS — F329 Major depressive disorder, single episode, unspecified: Secondary | ICD-10-CM

## 2018-08-10 NOTE — Telephone Encounter (Signed)
LMTCB to see if patient has enough to last until tomorrow.

## 2018-08-11 ENCOUNTER — Telehealth: Payer: Self-pay | Admitting: Lab

## 2018-08-11 ENCOUNTER — Other Ambulatory Visit: Payer: Self-pay | Admitting: Family Medicine

## 2018-08-11 DIAGNOSIS — F329 Major depressive disorder, single episode, unspecified: Secondary | ICD-10-CM

## 2018-08-11 DIAGNOSIS — F32A Depression, unspecified: Secondary | ICD-10-CM

## 2018-08-11 NOTE — Telephone Encounter (Signed)
Theres no message in this thread?

## 2018-08-11 NOTE — Telephone Encounter (Signed)
Sorry this was supposed to been a refill for Xanax, ill resend it.

## 2018-10-01 ENCOUNTER — Telehealth: Payer: Self-pay

## 2018-10-01 ENCOUNTER — Other Ambulatory Visit: Payer: Self-pay

## 2018-10-01 NOTE — Telephone Encounter (Signed)
Copied from [place] [id number]. Topic: General - Other >> Oct 01, 2018 11:55 AM Antonieta Iba C wrote: Reason for CRM: pt called in to find out what his upcoming apt is about? Pt says that he would just like to be sure

## 2018-10-02 NOTE — Telephone Encounter (Signed)
Called Pt and explained to Pt appt is for HTN F/U

## 2018-10-06 ENCOUNTER — Encounter: Payer: Self-pay | Admitting: Family Medicine

## 2018-10-06 ENCOUNTER — Ambulatory Visit: Payer: Managed Care, Other (non HMO) | Admitting: Family Medicine

## 2018-10-06 ENCOUNTER — Other Ambulatory Visit: Payer: Self-pay

## 2018-10-06 VITALS — BP 142/88 | HR 57 | Temp 97.8°F | Resp 16 | Ht 66.0 in | Wt 170.8 lb

## 2018-10-06 DIAGNOSIS — W57XXXD Bitten or stung by nonvenomous insect and other nonvenomous arthropods, subsequent encounter: Secondary | ICD-10-CM

## 2018-10-06 DIAGNOSIS — I10 Essential (primary) hypertension: Secondary | ICD-10-CM

## 2018-10-06 DIAGNOSIS — F419 Anxiety disorder, unspecified: Secondary | ICD-10-CM

## 2018-10-06 DIAGNOSIS — F329 Major depressive disorder, single episode, unspecified: Secondary | ICD-10-CM | POA: Diagnosis not present

## 2018-10-06 DIAGNOSIS — E781 Pure hyperglyceridemia: Secondary | ICD-10-CM

## 2018-10-06 DIAGNOSIS — F32A Depression, unspecified: Secondary | ICD-10-CM

## 2018-10-06 DIAGNOSIS — Z91018 Allergy to other foods: Secondary | ICD-10-CM

## 2018-10-06 LAB — COMPREHENSIVE METABOLIC PANEL
ALT: 19 U/L (ref 0–53)
AST: 15 U/L (ref 0–37)
Albumin: 4.4 g/dL (ref 3.5–5.2)
Alkaline Phosphatase: 67 U/L (ref 39–117)
BUN: 10 mg/dL (ref 6–23)
CO2: 27 mEq/L (ref 19–32)
Calcium: 9.1 mg/dL (ref 8.4–10.5)
Chloride: 104 mEq/L (ref 96–112)
Creatinine, Ser: 1.04 mg/dL (ref 0.40–1.50)
GFR: 74.4 mL/min (ref >60.00)
Glucose, Bld: 97 mg/dL (ref 70–99)
Potassium: 4.1 mEq/L (ref 3.5–5.1)
Sodium: 138 mEq/L (ref 135–145)
Total Bilirubin: 0.7 mg/dL (ref 0.2–1.2)
Total Protein: 6.9 g/dL (ref 6.0–8.3)

## 2018-10-06 LAB — LIPID PANEL
Cholesterol: 137 mg/dL (ref 0–200)
HDL: 52.8 mg/dL (ref >39.00)
LDL Cholesterol: 59 mg/dL (ref 0–99)
NonHDL: 84.36
Total CHOL/HDL Ratio: 3
Triglycerides: 128 mg/dL (ref 0.0–149.0)
VLDL: 25.6 mg/dL (ref 0.0–40.0)

## 2018-10-06 LAB — CBC
HCT: 43.6 % (ref 39.0–52.0)
Hemoglobin: 14.8 g/dL (ref 13.0–17.0)
MCHC: 34 g/dL (ref 30.0–36.0)
MCV: 98.5 fl (ref 78.0–100.0)
Platelets: 165 10*3/uL (ref 150.0–400.0)
RBC: 4.43 Mil/uL (ref 4.22–5.81)
RDW: 12.5 % (ref 11.5–15.5)
WBC: 4.7 10*3/uL (ref 4.0–10.5)

## 2018-10-06 LAB — MAGNESIUM: Magnesium: 2.1 mg/dL (ref 1.5–2.5)

## 2018-10-06 MED ORDER — LISINOPRIL 10 MG PO TABS: 10.0000 mg | ORAL_TABLET | Freq: Every day | ORAL | 1 refills | Status: DC

## 2018-10-06 NOTE — Progress Notes (Signed)
Subjective:    Patient ID: Nathan Fox, male    DOB: 07-Jul-1965, 53 y.o.   MRN: [id number]  HPI   Patient presents to clinic for follow-up on hypertension, acid reflux and anxiety.  Overall feeling well.  Tolerating lisinopril without any issues.  Monitors blood pressure at home on occasion with blood pressure cuff, usually will check 1 or 2 times a week and readings are always in the 130s over 80s.  Denies any adverse effects with the lisinopril.  No cough, chest pain, shortness of breath, wheezing or lower extremity swelling.  PPI it works well to keep his acid reflux under control.  Denies excessive belching, bloating, nausea, vomiting or diarrhea.  Patient was bit by a tick last year and it did [facility] which she was treated with doxycycline course for.  Also showed slightly positive alpha gal panel.  This past year patient has been avoiding beef and pork products due to this.  States he will eat small pieces of bacon on occasion and these do not seem to bother his stomach at all.  He would like repeat of the alpha gal panel to see where his titer stands.  Anxiety well controlled.  He stopped taking the 5 mg Lexapro, and felt he did not no longer needed it.  Will use 0.25 mg tablet Xanax very sparingly, only uses this in times of a lot of stress.  Has not used Xanax in many weeks.  Patient retired at the beginning of this month, so feels his stress level and life will go down significantly.  Patient Active Problem List   Diagnosis Date Noted  . Post-nasal drip 02/09/2018  . Throat dry 02/09/2018  . Allergy to alpha-gal 02/04/2018  . High triglycerides 12/24/2017  . Anxiety and depression 12/24/2017  . Tick bite 12/24/2017  . Abdominal pain, right upper quadrant   . Intestinal metaplasia of gastric mucosa   . Stomach irritation   . Columnar epithelial-lined lower esophagus   . Change in bowel habits   . Second degree hemorrhoids   . Gastric  pain   . Gastritis   . Acid reflux 08/30/2014  . Hypertension 08/30/2014   Social History   Tobacco Use  . Smoking status: Never Smoker  . Smokeless tobacco: Current User    Types: Chew  Substance Use Topics  . Alcohol use: Yes    Alcohol/week: 0.0 standard drinks    Comment: occas.   Review of Systems  Constitutional: Negative for chills, fatigue and fever.  HENT: Negative for congestion, ear pain, sinus pain and sore throat.   Eyes: Negative.   Respiratory: Negative for cough, shortness of breath and wheezing.   Cardiovascular: Negative for chest pain, palpitations and leg swelling.  Gastrointestinal: Negative for abdominal pain, diarrhea, nausea and vomiting.  Genitourinary: Negative for dysuria, frequency and urgency.  Musculoskeletal: Negative for arthralgias and myalgias.  Skin: Negative for color change, pallor and rash.  Neurological: Negative for syncope, light-headedness and headaches.  Psychiatric/Behavioral: The patient is not nervous/anxious.       Objective:   Physical Exam Vitals signs and nursing note reviewed.  Constitutional:      General: He is not in acute distress.    Appearance: He is not ill-appearing, toxic-appearing or diaphoretic.  HENT:     Head: Normocephalic and atraumatic.  Eyes:     General: No scleral icterus.    Extraocular Movements: Extraocular movements intact.     Conjunctiva/sclera: Conjunctivae normal.  Pupils: Pupils are equal, round, and reactive to light.  Neck:     Musculoskeletal: Normal range of motion and neck supple. No neck rigidity.     Vascular: No carotid bruit.  Cardiovascular:     Rate and Rhythm: Normal rate and regular rhythm.     Heart sounds: Normal heart sounds.  Pulmonary:     Effort: Pulmonary effort is normal. No respiratory distress.     Breath sounds: Normal breath sounds.  Abdominal:     General: Bowel sounds are normal. There is no distension.     Palpations: Abdomen is soft. There is no mass.      Tenderness: There is no abdominal tenderness. There is no right CVA tenderness, left CVA tenderness, guarding or rebound.     Hernia: No hernia is present.  Musculoskeletal:     Right lower leg: No edema.     Left lower leg: No edema.  Skin:    General: Skin is warm and dry.     Capillary Refill: Capillary refill takes less than 2 seconds.     Coloration: Skin is not jaundiced or pale.  Neurological:     General: No focal deficit present.     Mental Status: He is alert and oriented to person, place, and time.  Psychiatric:        Mood and Affect: Mood normal.        Behavior: Behavior normal.        Thought Content: Thought content normal.        Judgment: Judgment normal.    BP Readings from Last 3 Encounters:  10/06/18 (!) 142/88  04/06/18 128/78  03/16/18 (!) 135/91   Today's Vitals   10/06/18 0813  BP: (!) 142/88  Pulse: (!) 57  Resp: 16  Temp: 97.8 F (36.6 C)  TempSrc: Temporal  SpO2: 98%  Weight: 170 lb 12.8 oz (77.5 kg)  Height: 5\' 6"  (1.676 m)   Body mass index is 27.57 kg/m.    Assessment & Plan:    Hypertension-patient tolerating lisinopril without any adverse effects and is at home readings have been very good.  He will continue 10 mg dose and continue to spot check blood pressure a few times during the week to keep close eye on things.  He is physically active with his fishing, boating and walking/hiking.  Tick bite/food allergy - we will repeat alpha gal panel to follow-up on titer levels.  Patient advised that due to his titers being on the lower end previously, I think it would be okay to slowly reintroduce pork and beef products in his diet and see how he responds.  Advised to do this in small amounts and monitor himself for any stomach upset.  High triglycerides - patient has history of elevated triglycerides, will follow-up with repeat lipid panel to see if they have improved.  Acid reflux  - he will continue PPI as this is helpful to keep acid  reflux symptoms under control.  We will include magnesium level and lab work to check that this electrolyte is remaining stable while on the PPI.  Anxiety- he would prefer not to be on a daily medication such as a Lexapro.  He only uses Xanax 25 mg sparingly.  Patient will follow-up in 6 months for recheck on chronic conditions.  He will return to clinic sooner if any issues arise.

## 2018-10-12 LAB — ALPHA-GAL PANEL
Beef IgE: <0.1 kU/L (ref <0.35)
Class: 0
Class: 0
Class: 0
Galactose-alpha-1,3-galactose IgE: <0.1 kU/L (ref <0.10)
LAMB/MUTTON IGE: <0.1 kU/L (ref <0.35)
Pork IgE: <0.1 kU/L (ref <0.35)

## 2018-10-13 ENCOUNTER — Telehealth: Payer: Self-pay

## 2018-10-13 NOTE — Telephone Encounter (Signed)
Patient said that he spoke to someone today and will go to [facility]th[facility] tent to get a COVID test tomorrow.  Patient will call back to scheduled a virtual visit if he starts having any symptoms.  Confirmed that patient is quarantining.

## 2018-10-13 NOTE — Telephone Encounter (Signed)
Copied from [place] [id number]. Topic: General - Other >> Oct 13, 2018  4:05 PM Antonieta Iba C wrote: Reason for CRM: pt called in to be advised. Pt say that his wife tested positive for covid. Pt is concerned about being a carrier. Pt says that he is not having any symptoms.

## 2018-10-14 ENCOUNTER — Other Ambulatory Visit: Payer: Self-pay

## 2018-10-14 DIAGNOSIS — Z20822 Contact with and (suspected) exposure to covid-19: Secondary | ICD-10-CM

## 2018-10-14 NOTE — Telephone Encounter (Signed)
Please see if he is willing to do virtual to discuss symptoms to look out for, process of self quarantine since he is being tested  Thanks,  LG

## 2018-10-15 ENCOUNTER — Telehealth: Payer: Self-pay | Admitting: *Deleted

## 2018-10-15 ENCOUNTER — Ambulatory Visit: Payer: Self-pay | Admitting: *Deleted

## 2018-10-15 LAB — NOVEL CORONAVIRUS, NAA: SARS-CoV-2, NAA: DETECTED — AB

## 2018-10-15 NOTE — Telephone Encounter (Signed)
-----   Message from Tarry Kos, RN sent at 10/15/2018 12:35 PM EDT ----- Reviewed positive [phone] results with patient. See today's triage note. Will contact [facility].

## 2018-10-15 NOTE — Telephone Encounter (Signed)
Reviewed precautions and isolation measures due to his positive covid19 test. Having low grade fever at this time. Care Advice discussed regarding symptoms. When to discontinue isolation and precautions for others and himself. Instructed to hydrate well and monitor breathing. Any difficulty, call back or if after hours seek treatment at ED. Stated he understood all instructions. Will check to see if local HD has been notified of this patient's positive covid.  Reason for Disposition . [1] Other NON-URGENT information for PCP AND [2] does not require PCP response  Answer Assessment - Initial Assessment Questions 1. REASON FOR CALL or QUESTION: "What is your reason for calling today?" or "How can I best help you?" or "What question do you have that I can help answer?"    Received a positive covid19 results.  Protocols used: PCP CALL - NO TRIAGE-A-AH, INFORMATION ONLY CALL-A-AH

## 2018-10-15 NOTE — Telephone Encounter (Signed)
Left message for patient to return call to office to schedule virtual visit with NP for COVID 19 .

## 2018-10-15 NOTE — Telephone Encounter (Signed)
Copied from [place] [id number]. Topic: Quick Communication - See Telephone Encounter >> Oct 15, 2018 11:57 AM Loma Boston wrote: CRM for notification. See Telephone encounter for: 10/15/18. PT is stating that has covid as with wife, he is handling symptoms well, just sweating it out but said he was told to let his dr know. If you need to FU with him his number is [phone]

## 2018-10-16 NOTE — Telephone Encounter (Signed)
Your schedule is full for today.

## 2018-10-16 NOTE — Telephone Encounter (Signed)
He can do virtual today to discuss. Please schedule

## 2018-10-16 NOTE — Telephone Encounter (Signed)
We can do visit Monday for follow up  He must remain on quarantine for 14 days from symptoms onset and must be symptoms/fever free for 72 hours without tylenol or motrin to come off quarantine

## 2018-10-16 NOTE — Telephone Encounter (Signed)
Returned Pt called,  Pt stated since he is  positive for Covid 19 how long does he need to stay on Quarantine and does he need a F/U appt. Pt stated he is feeling much better but he has been sweating a lot last night. Pt stated his wife had to go to the hospital last night due to chest pain, and she is positive for Covid 19 also.

## 2018-10-16 NOTE — Telephone Encounter (Signed)
Pt scheduled for Doxy visit 10/19/2018

## 2018-10-19 ENCOUNTER — Ambulatory Visit (INDEPENDENT_AMBULATORY_CARE_PROVIDER_SITE_OTHER): Payer: Managed Care, Other (non HMO) | Admitting: Family Medicine

## 2018-10-19 ENCOUNTER — Other Ambulatory Visit: Payer: Self-pay

## 2018-10-19 DIAGNOSIS — U071 COVID-19: Secondary | ICD-10-CM

## 2018-10-19 NOTE — Progress Notes (Signed)
Patient ID: Nathan Fox, male   DOB: 04-25-1965, 53 y.o.   MRN: [id number]    Virtual Visit via video Note  This visit type was conducted due to national recommendations for restrictions regarding the COVID-19 pandemic (e.g. social distancing).  This format is felt to be most appropriate for this patient at this time.  All issues noted in this document were discussed and addressed.  No physical exam was performed (except for noted visual exam findings with Video Visits).   I connected with Vinie Sill today at  1:00 PM EDT by a video enabled telemedicine application or telephone and verified that I am speaking with the correct person using two identifiers. Location patient: home Location provider: work or home office Persons participating in the virtual visit: patient, provider  I discussed the limitations, risks, security and privacy concerns of performing an evaluation and management service by video and the availability of in person appointments. I also discussed with the patient that there may be a patient responsible charge related to this service. The patient expressed understanding and agreed to proceed.  HPI:  Patient and I connected via video to followup after +COVID test on 10/14/2018.   States the first 2-3 days, he felt feverish/tired and some headache.  States yesterday and today he started to feel little bit better, still a little fatigued but overall improved.  Denies cough, shortness of breath or wheezing.  Denies vomiting or diarrhea.  Has been trying to keep up good fluid intake.  States his wife has been pretty ill; she tested positive for COVID-19 a few days before his test was performed/came back.  States she had to go to the emergency room last week for evaluation.  She has a follow-up appointment with her doctor today to further assess possibility of pneumonia due to the harsh cough she has.  Patient states he is glad he is feeling better so he can be there for her.   ROS: See pertinent positives and negatives per HPI.  Past Medical History:  Diagnosis Date  . Chicken pox   . Depression   . GERD (gastroesophageal reflux disease)   . Hypertension   . PTSD (post-traumatic stress disorder)     Past Surgical History:  Procedure Laterality Date  . COLONOSCOPY WITH PROPOFOL N/A 09/23/2014   Procedure: COLONOSCOPY WITH PROPOFOL;  Surgeon: Lucilla Lame, MD;  Location: [facility];  Service: Endoscopy;  Laterality: N/A;  . ESOPHAGOGASTRODUODENOSCOPY (EGD) WITH PROPOFOL N/A 09/23/2014   Procedure: ESOPHAGOGASTRODUODENOSCOPY (EGD) WITH PROPOFOL;  Surgeon: Lucilla Lame, MD;  Location: [facility];  Service: Endoscopy;  Laterality: N/A;  gastric biopsy  . ESOPHAGOGASTRODUODENOSCOPY (EGD) WITH PROPOFOL N/A 05/07/2017   Procedure: ESOPHAGOGASTRODUODENOSCOPY (EGD) WITH PROPOFOL;  Surgeon: Virgel Manifold, MD;  Location: [facility];  Service: Endoscopy;  Laterality: N/A;  . HERNIA REPAIR     x 2   . TONSILLECTOMY    . WRIST SURGERY Left    cyst    Family History  Problem Relation Age of Onset  . Hypertension Mother   . Stroke Mother   . Arthritis Mother   . Asthma Mother   . COPD Mother   . Depression Mother   . Hyperlipidemia Mother   . Asthma Maternal Grandmother   . Hearing loss Maternal Grandmother   . Heart attack Maternal Grandmother   . Early death Maternal Grandmother   . Alcohol abuse Maternal Grandfather   . Asthma Maternal Grandfather   . Early death Maternal Grandfather   .  Hearing loss Maternal Grandfather    Social History   Tobacco Use  . Smoking status: Never Smoker  . Smokeless tobacco: Current User    Types: Chew  Substance Use Topics  . Alcohol use: Yes    Alcohol/week: 0.0 standard drinks    Comment: occas.    Current Outpatient Medications:  .  ALPRAZolam (XANAX) 0.25 MG tablet, TAKE 1 TABLET BY MOUTH 2 TIMES DAILY AS NEEDED FOR ANXIETY, Disp: 20 tablet, Rfl: 0 .  EPINEPHrine 0.3 mg/0.3 mL IJ SOAJ  injection, , Disp: , Rfl:  .  fluticasone (FLONASE) 50 MCG/ACT nasal spray, Place 1 spray into both nostrils daily. Pt does not remember exact name of medication, thinks it is flonase, Disp: , Rfl:  .  lisinopril (ZESTRIL) 10 MG tablet, Take 1 tablet (10 mg total) by mouth daily., Disp: 90 tablet, Rfl: 1 .  omeprazole (PRILOSEC) 20 MG capsule, TAKE 1 CAPSULE BY MOUTH EVERY DAY, Disp: 90 capsule, Rfl: 1  EXAM:  VITALS per patient if applicable:  GENERAL: alert, oriented, appears well and in no acute distress  HEENT: atraumatic, conjunttiva clear, no obvious abnormalities on inspection of external nose and ears  NECK: normal movements of the head and neck  LUNGS: on inspection no signs of respiratory distress, breathing rate appears normal, no obvious gross SOB, gasping or wheezing  CV: no obvious cyanosis  MS: moves all visible extremities without noticeable abnormality  PSYCH/NEURO: pleasant and cooperative, no obvious depression or anxiety, speech and thought processing grossly intact  ASSESSMENT AND PLAN:  Discussed the following assessment and plan:  +COVID-19 virus infection - overall he is feeling improved.  Advised over-the-counter medications such as Tylenol can be used to help treat pain or fevers, Robitussin can be used to help calm cough, allergy medication such as Claritin or Allegra can help reduce congestion.  Also discussed getting plenty of rest and increasing fluid intake.   Also advised to monitor self for any worsening symptoms, advised if severe shortness of breath develops, high fever that is not reduced with use of Tylenol, chest pain, severe vomiting or diarrhea  --patient must call on-call and or go to ER right away for evaluation. patient verbalized understanding of these instructions.    Advised that due to his wife having severe illness, I would recommend he do a full 14-day quarantine just to be on the safe side even though he is feeling better.     I  discussed the assessment and treatment plan with the patient. The patient was provided an opportunity to ask questions and all were answered. The patient agreed with the plan and demonstrated an understanding of the instructions.   The patient was advised to call back or seek an in-person evaluation if the symptoms worsen or if the condition fails to improve as anticipated.  15 minutes spent over video call with patient.   Tracey HarriesLauren M Yuji Walth, FNP

## 2018-12-30 ENCOUNTER — Ambulatory Visit: Payer: Managed Care, Other (non HMO) | Admitting: Allergy and Immunology

## 2019-01-28 ENCOUNTER — Encounter: Payer: Managed Care, Other (non HMO) | Admitting: Internal Medicine

## 2019-04-08 ENCOUNTER — Encounter: Payer: Managed Care, Other (non HMO) | Admitting: Family Medicine

## 2019-04-16 LAB — SURGICAL PATHOLOGY

## 2019-05-16 IMAGING — CT CT NECK W/ CM
4 of 6 series · 13 of 35 positions shown, 15 images · IV contrast (iopamidol)
Comparison: Cervical spine CT 05/14/2012

CLINICAL DATA: Left submandibular mass. Intermittent sinus pain and
drainage for 1 month. History of tonsillectomy.

EXAM:
CT NECK WITH CONTRAST
TECHNIQUE: Multidetector CT imaging of the neck was performed using the
standard protocol following the bolus administration of intravenous
contrast.
CONTRAST:  75mL 9P5KZO-[id number] IOPAMIDOL ([id number]-[id number]) INJECTION 61%

[Series 3: axial bone neck · axial · 0.57mm/px · z∈[-723,-637]mm · 2 of 130 slices shown]
[im 44/130  bone]
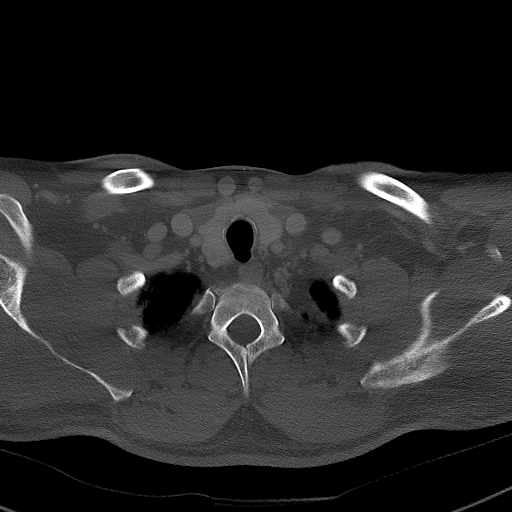
[im 87/130  bone]
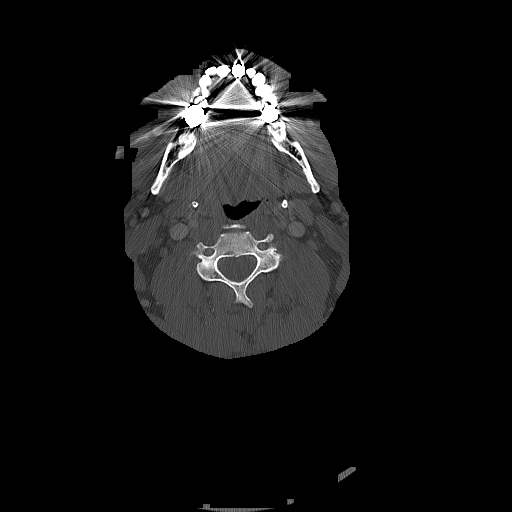

[Series 5: coronal neck neck (person_name) · coronal · 0.56mm/px · 3 of 117 slices shown]
[im 31/117  bone]
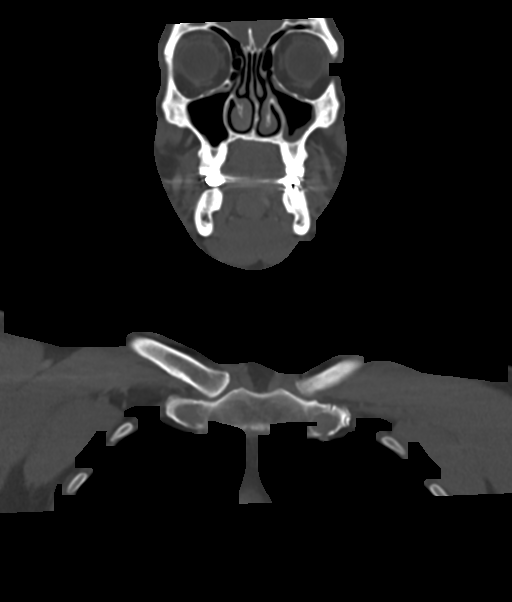
[im 49/117  bone]
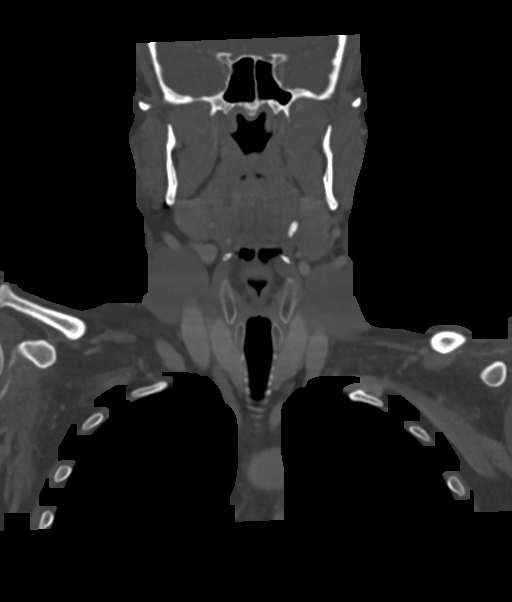
[im 68/117  bone]
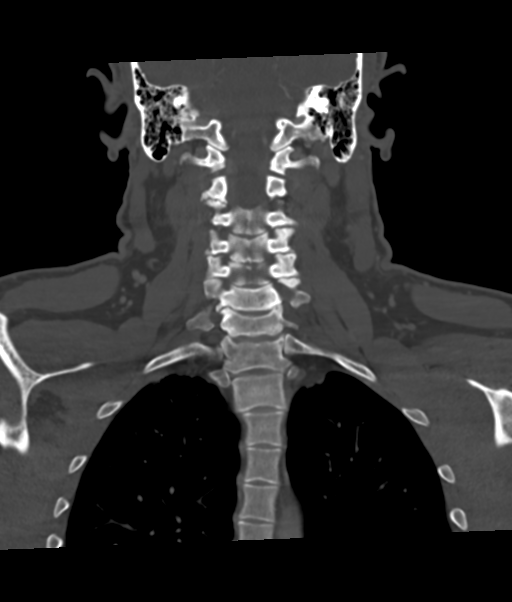

[Series 7: sagittal neck neck (person_name) · sagittal · 0.46mm/px · 5 of 144 slices shown, 6 images]
[im 48/144  bone]
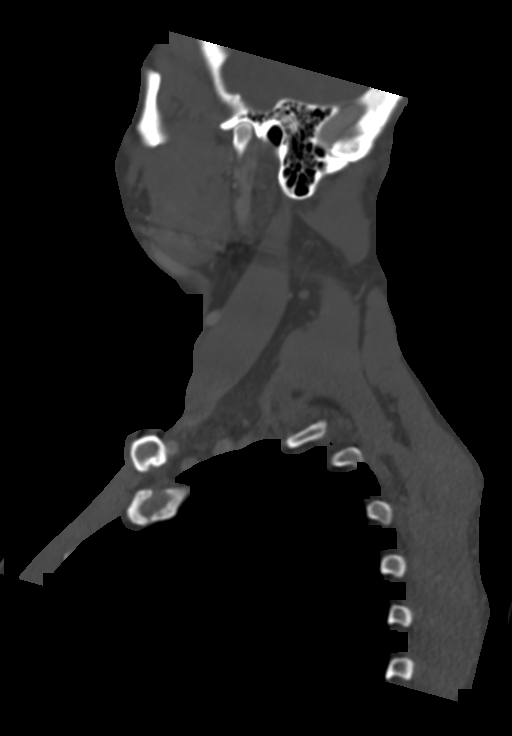
[im 60/144  bone]
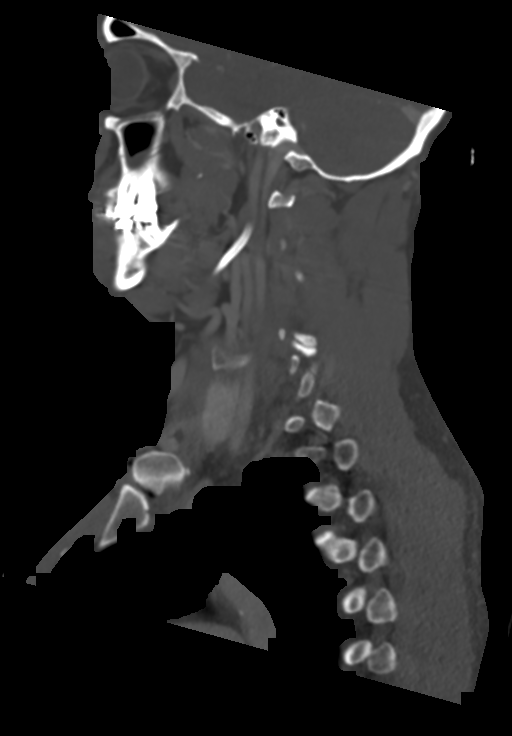
[im 72/144  soft-tissue]
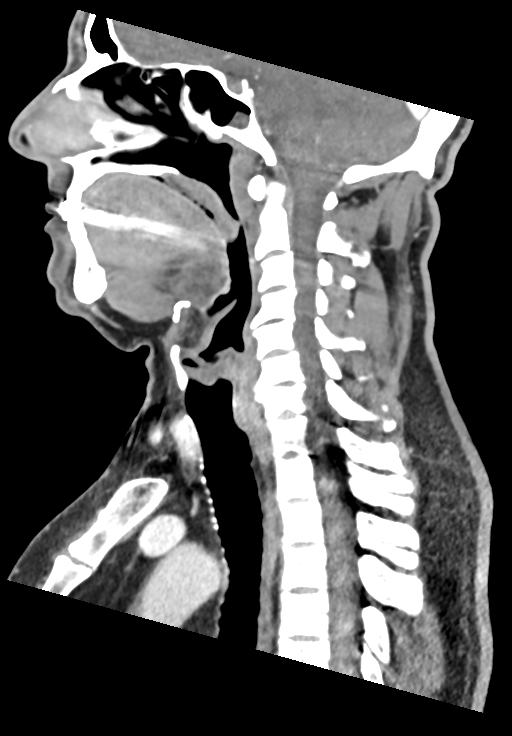
[im 72/144  bone]
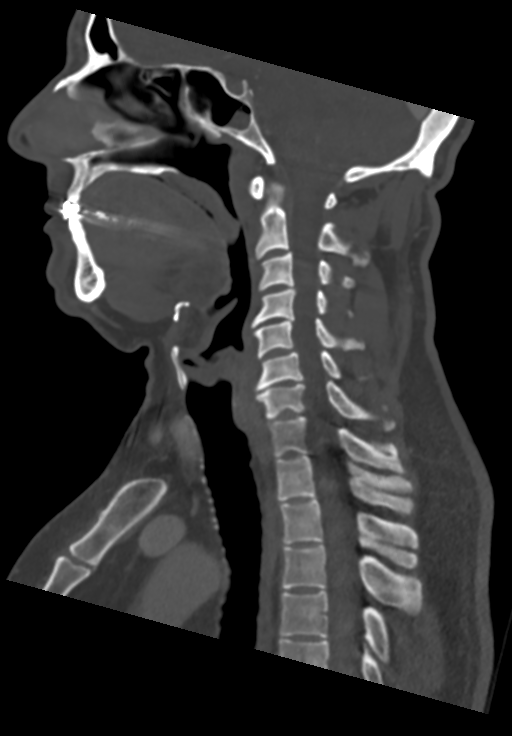
[im 84/144  bone]
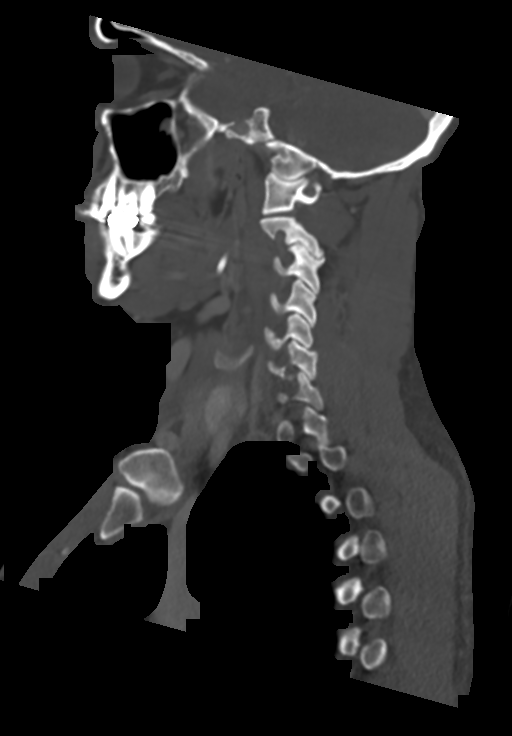
[im 96/144  bone]
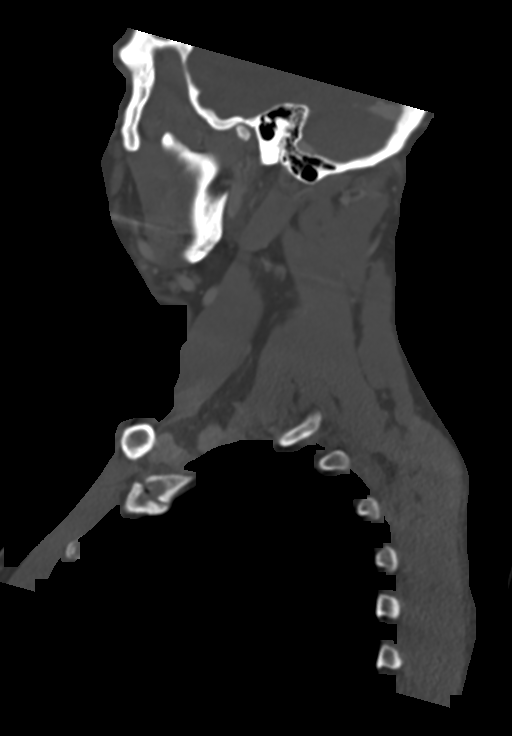

[Series 9: ax oropharynx neck neck (person_name) · axial · 0.46mm/px · z∈[-792,-631]mm · 3 of 169 slices shown, 4 images]
[im 43/169  soft-tissue]
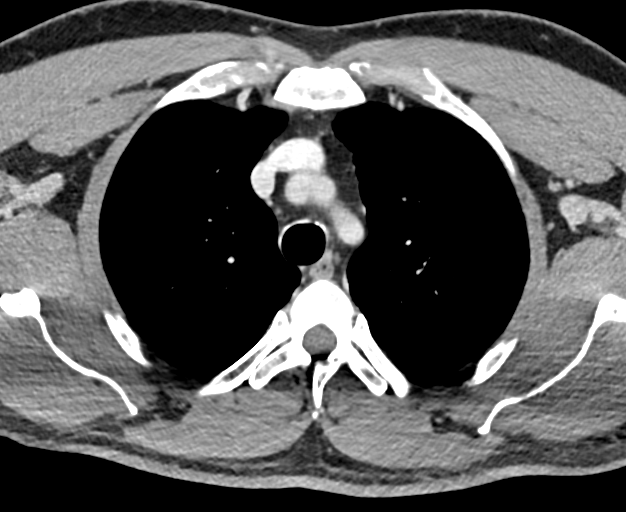
[im 43/169  bone]
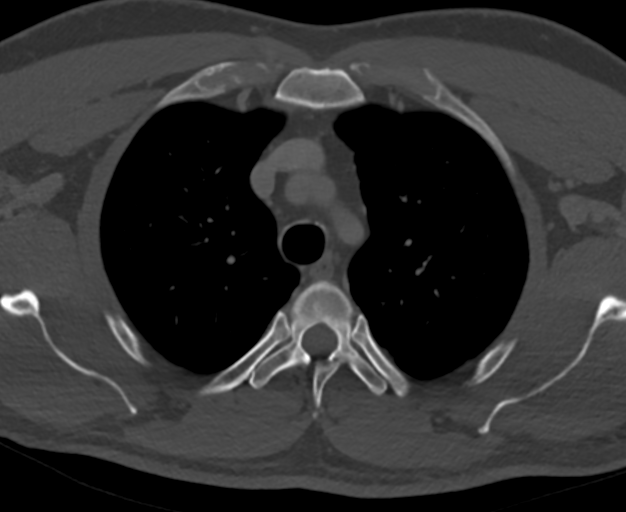
[im 85/169  bone]
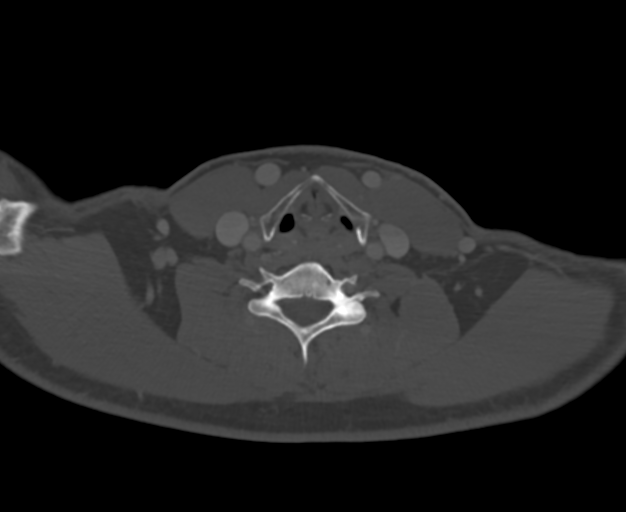
[im 127/169  bone]
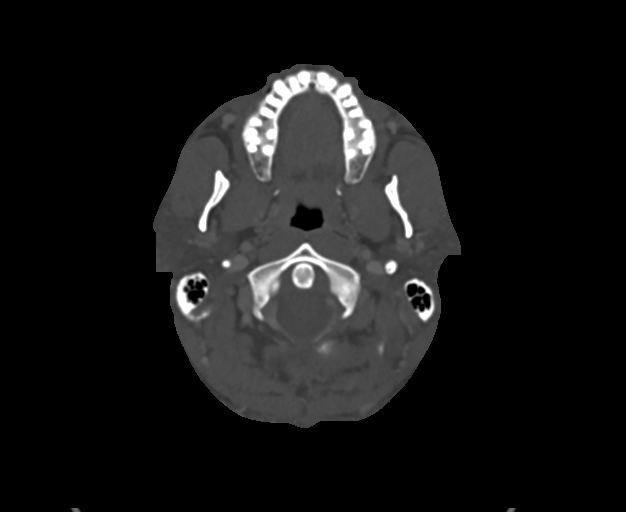

[13 of 35 positions shown; findings below may reference images not displayed]

FINDINGS: Pharynx and larynx: No evidence of mass or swelling. Patent airway.

Salivary glands: 2.4 x 1.6 x 2.0 cm mass inseparable from the
inferior aspect of the left submandibular gland with asymmetric soft
tissue present in this location on the prior cervical spine CT
though poorly evaluated due to technique, field-of-view, and lack of
IV contrast. Unremarkable right submandibular and bilateral parotid
glands. No acute inflammation or calculi.

Thyroid: Subcentimeter isodense nodule projecting posteriorly from
the right thyroid lobe versus an adjacent parathyroid nodule,
unchanged from 4288 favoring benignity.

Lymph nodes: Aside from the left submandibular mass, no enlarged or
suspicious lymph nodes are identified in the neck.

Vascular: Major vascular structures of the neck are patent.

Limited intracranial: Unremarkable.

Visualized orbits: Unremarkable.

Mastoids and visualized paranasal sinuses: Mild scattered mucosal
thickening in the paranasal sinuses. Left sphenoid sinus bubbly
secretions. Clear mastoid air cells.

Skeleton: No suspicious osseous lesion. Mild-to-moderate cervical
disc and facet degeneration.

Upper chest: Clear lung apices.

Other: None.
IMPRESSION: 1. 2.4 cm mass inseparable from the inferior aspect of the left
submandibular gland, possibly primary salivary neoplasm or adjacent
enlarged lymph node.
2. No lymphadenopathy identified elsewhere in the neck.

## 2020-02-24 ENCOUNTER — Telehealth: Payer: Self-pay | Admitting: Cardiology

## 2020-02-24 DIAGNOSIS — K219 Gastro-esophageal reflux disease without esophagitis: Secondary | ICD-10-CM | POA: Insufficient documentation

## 2020-02-24 DIAGNOSIS — F4310 Post-traumatic stress disorder, unspecified: Secondary | ICD-10-CM | POA: Insufficient documentation

## 2020-02-24 DIAGNOSIS — F32A Depression, unspecified: Secondary | ICD-10-CM | POA: Insufficient documentation

## 2020-02-24 DIAGNOSIS — B019 Varicella without complication: Secondary | ICD-10-CM | POA: Insufficient documentation

## 2020-02-24 NOTE — Telephone Encounter (Signed)
Scheduled patient for 02/25/20 at 1:40 with Dr. Tomie China as new patient

## 2020-02-25 ENCOUNTER — Ambulatory Visit (INDEPENDENT_AMBULATORY_CARE_PROVIDER_SITE_OTHER): Payer: Managed Care, Other (non HMO)

## 2020-02-25 ENCOUNTER — Encounter: Payer: Self-pay | Admitting: Cardiology

## 2020-02-25 ENCOUNTER — Other Ambulatory Visit: Payer: Self-pay

## 2020-02-25 ENCOUNTER — Ambulatory Visit (INDEPENDENT_AMBULATORY_CARE_PROVIDER_SITE_OTHER): Payer: Managed Care, Other (non HMO) | Admitting: Cardiology

## 2020-02-25 VITALS — BP 132/80 | HR 66 | Ht 66.0 in | Wt 175.6 lb

## 2020-02-25 DIAGNOSIS — R011 Cardiac murmur, unspecified: Secondary | ICD-10-CM

## 2020-02-25 DIAGNOSIS — R002 Palpitations: Secondary | ICD-10-CM

## 2020-02-25 HISTORY — DX: Cardiac murmur, unspecified: R01.1

## 2020-02-25 HISTORY — DX: Palpitations: R00.2

## 2020-02-25 NOTE — Patient Instructions (Addendum)
Medication Instructions:  *If you need a refill on your cardiac medications before your next appointment, please call your pharmacy*  Lab Work: If you have labs (blood work) drawn today and your tests are completely normal, you will receive your results only by: Marland Kitchen MyChart Message (if you have MyChart) OR . A paper copy in the mail If you have any lab test that is abnormal or we need to change your treatment, we will call you to review the results.  Testing/Procedures: Your physician has recommended that you wear a ZIO monitor for 3 days. Zio monitors are medical devices that record the heart's electrical activity. Doctors most often use these monitors to diagnose arrhythmias. Arrhythmias are problems with the speed or rhythm of the heartbeat. The monitor is a small, portable device. You can wear one while you do your normal daily activities. This is usually used to diagnose what is causing palpitations/syncope (passing out).   Your physician has requested that you have an echocardiogram. Echocardiography is a painless test that uses sound waves to create images of your heart. It provides your doctor with information about the size and shape of your heart and how well your heart's chambers and valves are working. This procedure takes approximately one hour. There are no restrictions for this procedure.  Follow-Up: At [facility], you and your health needs are our priority.  As part of our continuing mission to provide you with exceptional heart care, we have created designated Provider Care Teams.  These Care Teams include your primary Cardiologist (physician) and Advanced Practice Providers (APPs -  Physician Assistants and Nurse Practitioners) who all work together to provide you with the care you need, when you need it.  We recommend signing up for the patient portal called "MyChart".  Sign up information is provided on this After Visit Summary.  MyChart is used to connect with patients for  Virtual Visits (Telemedicine).  Patients are able to view lab/test results, encounter notes, upcoming appointments, etc.  Non-urgent messages can be sent to your provider as well.   To learn more about what you can do with MyChart, go to [email or web address].    Your next appointment:   Your physician recommends that you schedule a follow-up appointment in: 4 MONTHS with Dr. Tomie China  The format for your next appointment:   In Person with Belva Crome, MD

## 2020-02-25 NOTE — Progress Notes (Signed)
Cardiology Office Note:    Date:  02/25/2020   ID:  Nathan Fox, DOB 1965-06-15, MRN [id number]  PCP:  Marlyn Corporal, PA  Cardiologist:  Garwin Brothers, MD   Referring MD: Abran Richard, PA-C    ASSESSMENT:    1. Palpitations   2. Cardiac murmur    PLAN:    In order of problems listed above:  1. Primary prevention stressed with patient.  Importance of compliance with diet medication stressed any vocalized understanding. 2. Palpitations: I reassured the patient about my findings.  His symptoms are not very concerning.  In view of his concern about these feelings I will do a 3-day monitoring to see if there is any significant arrhythmia that may be causing the symptoms 3. Cardiac murmur: Echocardiogram will be done to assess murmur heard on auscultation. 4. Diet was emphasized.  Weight reduction stressed.  I told him to exercise at least 30 to 45 minutes of walking on a daily basis 5 days a week and he promises to do so. 5.  Patient will be seen in follow-up appointment in 4 months or earlier if the patient has any concerns    Medication Adjustments/Labs and Tests Ordered: Current medicines are reviewed at length with the patient today.  Concerns regarding medicines are outlined above.  Orders Placed This Encounter  Procedures  . LONG TERM MONITOR (3-14 DAYS)  . EKG 12-Lead  . ECHOCARDIOGRAM COMPLETE   No orders of the defined types were placed in this encounter.    History of Present Illness:    Nathan Fox is a 55 y.o. male who is being seen today for the evaluation of palpitations at the request of Abran Richard, PA-C.  Patient is a pleasant 55 year old male.  He is overall healthy.  He denies any history of hypertension dyslipidemia diabetes mellitus.  He leads a sedentary lifestyle.  He is an active gentleman.  He mentions to me that he has palpitations occasionally.  No syncope no dizziness or any symptoms.  He is concerned about it.  Therefore he is here  for evaluation.  He denies chest pain.  Upon sexual activity he has no shortness of breath or chest pain.  Past Medical History:  Diagnosis Date  . Abdominal pain, right upper quadrant   . Acid reflux 08/30/2014  . Allergy to alpha-gal 02/04/2018  . Anxiety and depression 12/24/2017  . Change in bowel habits   . Chicken pox   . Columnar epithelial-lined lower esophagus   . Depression   . Gastric pain   . Gastritis   . GERD (gastroesophageal reflux disease)   . High triglycerides 12/24/2017  . Hypertension   . Intestinal metaplasia of gastric mucosa   . Post-nasal drip 02/09/2018  . PTSD (post-traumatic stress disorder)   . Second degree hemorrhoids   . Stomach irritation   . Throat dry 02/09/2018  . Tick bite 12/24/2017    Past Surgical History:  Procedure Laterality Date  . COLONOSCOPY WITH PROPOFOL N/A 09/23/2014   Procedure: COLONOSCOPY WITH PROPOFOL;  Surgeon: Midge Minium, MD;  Location: [facility];  Service: Endoscopy;  Laterality: N/A;  . ESOPHAGOGASTRODUODENOSCOPY (EGD) WITH PROPOFOL N/A 09/23/2014   Procedure: ESOPHAGOGASTRODUODENOSCOPY (EGD) WITH PROPOFOL;  Surgeon: Midge Minium, MD;  Location: [facility];  Service: Endoscopy;  Laterality: N/A;  gastric biopsy  . ESOPHAGOGASTRODUODENOSCOPY (EGD) WITH PROPOFOL N/A 05/07/2017   Procedure: ESOPHAGOGASTRODUODENOSCOPY (EGD) WITH PROPOFOL;  Surgeon: Pasty Spillers, MD;  Location: [facility];  Service: Endoscopy;  Laterality: N/A;  . HERNIA REPAIR     x 2   . TONSILLECTOMY    . WRIST SURGERY Left    cyst    Current Medications: Current Meds  Medication Sig  . ALPRAZolam (XANAX) 0.25 MG tablet TAKE 1 TABLET BY MOUTH 2 TIMES DAILY AS NEEDED FOR ANXIETY  . azelastine (ASTELIN) 0.1 % nasal spray Place 1 spray into both nostrils as needed.  Marland Kitchen EPINEPHrine 0.3 mg/0.3 mL IJ SOAJ injection Inject 0.3 mg into the muscle as needed.  . fluticasone (FLONASE) 50 MCG/ACT nasal spray Place 1 spray into both nostrils  as needed.  . hydrochlorothiazide (MICROZIDE) 12.5 MG capsule Take 12.5 mg by mouth daily.  Marland Kitchen losartan (COZAAR) 100 MG tablet Take 100 mg by mouth daily.  Marland Kitchen omeprazole (PRILOSEC) 20 MG capsule TAKE 1 CAPSULE BY MOUTH EVERY DAY     Allergies:   Bee venom and Alpha-gal   Social History   Socioeconomic History  . Marital status: Married    Spouse name: Not on file  . Number of children: Not on file  . Years of education: Not on file  . Highest education level: Not on file  Occupational History  . Not on file  Tobacco Use  . Smoking status: Never Smoker  . Smokeless tobacco: Current User    Types: Chew  Vaping Use  . Vaping Use: Never used  Substance and Sexual Activity  . Alcohol use: Yes    Alcohol/week: 0.0 standard drinks    Comment: occas.  . Drug use: No  . Sexual activity: Yes  Other Topics Concern  . Not on file  Social History Narrative  . Not on file   Social Determinants of Health   Financial Resource Strain: Not on file  Food Insecurity: Not on file  Transportation Needs: Not on file  Physical Activity: Not on file  Stress: Not on file  Social Connections: Not on file     Family History: The patient's family history includes Alcohol abuse in his maternal grandfather; Arthritis in his mother; Asthma in his maternal grandfather, maternal grandmother, and mother; COPD in his mother; Depression in his mother; Early death in his maternal grandfather and maternal grandmother; Hearing loss in his maternal grandfather and maternal grandmother; Heart attack in his maternal grandmother; Hyperlipidemia in his mother; Hypertension in his mother; Stroke in his mother.  ROS:   Please see the history of present illness.    All other systems reviewed and are negative.  EKGs/Labs/Other Studies Reviewed:    The following studies were reviewed today: I reviewed records from primary care physician.  EKG reveals sinus rhythm and nonspecific ST-T changes.   Recent  Labs: No results found for requested labs within last 8760 hours.  Recent Lipid Panel    Component Value Date/Time   CHOL 137 10/06/2018 0828   CHOL 126 01/13/2017 0904   TRIG 128.0 10/06/2018 0828   HDL 52.80 10/06/2018 0828   HDL 38 (L) 01/13/2017 0904   CHOLHDL 3 10/06/2018 0828   VLDL 25.6 10/06/2018 0828   LDLCALC 59 10/06/2018 0828   LDLCALC 55 01/13/2017 0904   LDLDIRECT 68.0 10/02/2017 1531    Physical Exam:    VS:  BP 132/80   Pulse 66   Ht 5\' 6"  (1.676 m)   Wt 175 lb 9.6 oz (79.7 kg)   SpO2 97%   BMI 28.34 kg/m     Wt Readings from Last 3 Encounters:  02/25/20 175 lb  9.6 oz (79.7 kg)  10/06/18 170 lb 12.8 oz (77.5 kg)  04/06/18 173 lb (78.5 kg)     GEN: Patient is in no acute distress HEENT: Normal NECK: No JVD; No carotid bruits LYMPHATICS: No lymphadenopathy CARDIAC: S1 S2 regular, 2/6 systolic murmur at the apex. RESPIRATORY:  Clear to auscultation without rales, wheezing or rhonchi  ABDOMEN: Soft, non-tender, non-distended MUSCULOSKELETAL:  No edema; No deformity  SKIN: Warm and dry NEUROLOGIC:  Alert and oriented x 3 PSYCHIATRIC:  Normal affect    Signed, Garwin Brothers, MD  02/25/2020 2:14 PM    [facility]

## 2020-03-24 ENCOUNTER — Other Ambulatory Visit: Payer: Self-pay

## 2020-03-24 ENCOUNTER — Ambulatory Visit (INDEPENDENT_AMBULATORY_CARE_PROVIDER_SITE_OTHER): Payer: Managed Care, Other (non HMO)

## 2020-03-24 DIAGNOSIS — R011 Cardiac murmur, unspecified: Secondary | ICD-10-CM

## 2020-03-24 DIAGNOSIS — R002 Palpitations: Secondary | ICD-10-CM | POA: Diagnosis not present

## 2020-03-24 LAB — ECHOCARDIOGRAM COMPLETE
Area-P 1/2: 4.49 cm2
Calc EF: 55.5 %
S' Lateral: 3.3 cm
Single Plane A2C EF: 56.6 %
Single Plane A4C EF: 53.1 %

## 2020-03-24 NOTE — Progress Notes (Signed)
Complete echocardiogram performed.  Jimmy Mckoy Bhakta RDCS, RVT  

## 2020-06-30 ENCOUNTER — Ambulatory Visit: Payer: Managed Care, Other (non HMO) | Admitting: Cardiology

## 2020-06-30 ENCOUNTER — Encounter: Payer: Self-pay | Admitting: Cardiology

## 2020-06-30 ENCOUNTER — Other Ambulatory Visit: Payer: Self-pay

## 2020-06-30 VITALS — BP 144/78 | HR 62 | Ht 66.0 in | Wt 178.2 lb

## 2020-06-30 DIAGNOSIS — E781 Pure hyperglyceridemia: Secondary | ICD-10-CM | POA: Diagnosis not present

## 2020-06-30 DIAGNOSIS — R002 Palpitations: Secondary | ICD-10-CM

## 2020-06-30 NOTE — Patient Instructions (Signed)

## 2020-06-30 NOTE — Progress Notes (Signed)
Cardiology Office Note:    Date:  06/30/2020   ID:  Nathan Fox, DOB September 28, 1965, MRN [id number]  PCP:  Marlyn Corporal, PA  Cardiologist:  Garwin Brothers, MD   Referring MD: Marlyn Corporal, PA    ASSESSMENT:    1. Palpitations   2. High triglycerides    PLAN:    In order of problems listed above:  1. Primary prevention stressed with the patient.  Importance of compliance with diet medication stressed any vocalized understanding. 2. Essential hypertension: Blood pressure stable and diet was emphasized.  Lifestyle modification also urged.  He walks half an hour on a daily basis. 3. Mixed dyslipidemia: Patient has hypertriglyceridemia but diet is well and lipids are fine. 4. Palpitations: These have resolved and he is happy about it. 5. Patient will be seen in follow-up appointment in 6 months or earlier if the patient has any concerns    Medication Adjustments/Labs and Tests Ordered: Current medicines are reviewed at length with the patient today.  Concerns regarding medicines are outlined above.  No orders of the defined types were placed in this encounter.  No orders of the defined types were placed in this encounter.    No chief complaint on file.    History of Present Illness:    Nathan Fox is a 55 y.o. male.  Patient has past medical history of hypertriglyceridemia and palpitations.  He denies any problems at this time and takes care of activities of daily living.  No chest pain orthopnea PND.  At the time of my evaluation, the patient is alert awake oriented and in no distress.  Past Medical History:  Diagnosis Date  . Abdominal pain, right upper quadrant   . Acid reflux 08/30/2014  . Allergy to alpha-gal 02/04/2018  . Anxiety and depression 12/24/2017  . Cardiac murmur 02/25/2020  . Change in bowel habits   . Chicken pox   . Columnar epithelial-lined lower esophagus   . Depression   . Gastric pain   . Gastritis   . GERD (gastroesophageal reflux  disease)   . High triglycerides 12/24/2017  . Hypertension   . Intestinal metaplasia of gastric mucosa   . Palpitations 02/25/2020  . Post-nasal drip 02/09/2018  . PTSD (post-traumatic stress disorder)   . Second degree hemorrhoids   . Stomach irritation   . Throat dry 02/09/2018  . Tick bite 12/24/2017    Past Surgical History:  Procedure Laterality Date  . COLONOSCOPY WITH PROPOFOL N/A 09/23/2014   Procedure: COLONOSCOPY WITH PROPOFOL;  Surgeon: Midge Minium, MD;  Location: [facility];  Service: Endoscopy;  Laterality: N/A;  . ESOPHAGOGASTRODUODENOSCOPY (EGD) WITH PROPOFOL N/A 09/23/2014   Procedure: ESOPHAGOGASTRODUODENOSCOPY (EGD) WITH PROPOFOL;  Surgeon: Midge Minium, MD;  Location: [facility];  Service: Endoscopy;  Laterality: N/A;  gastric biopsy  . ESOPHAGOGASTRODUODENOSCOPY (EGD) WITH PROPOFOL N/A 05/07/2017   Procedure: ESOPHAGOGASTRODUODENOSCOPY (EGD) WITH PROPOFOL;  Surgeon: Pasty Spillers, MD;  Location: [facility];  Service: Endoscopy;  Laterality: N/A;  . HERNIA REPAIR     x 2   . TONSILLECTOMY    . WRIST SURGERY Left    cyst    Current Medications: Current Meds  Medication Sig  . ALPRAZolam (XANAX) 0.25 MG tablet Take 0.25 mg by mouth 2 (two) times daily as needed for anxiety.  Marland Kitchen azelastine (ASTELIN) 0.1 % nasal spray Place 1 spray into both nostrils as needed for allergies or rhinitis.  Marland Kitchen EPINEPHrine 0.3 mg/0.3 mL IJ SOAJ injection  Inject 0.3 mg into the muscle as needed for anaphylaxis.  . fluticasone (FLONASE) 50 MCG/ACT nasal spray Place 1 spray into both nostrils as needed for allergies or rhinitis.  . hydrochlorothiazide (MICROZIDE) 12.5 MG capsule Take 12.5 mg by mouth daily.  Marland Kitchen losartan (COZAAR) 100 MG tablet Take 100 mg by mouth daily.  Marland Kitchen omeprazole (PRILOSEC) 20 MG capsule Take 20 mg by mouth daily.     Allergies:   Bee venom and Alpha-gal   Social History   Socioeconomic History  . Marital status: Married    Spouse name: Not on  file  . Number of children: Not on file  . Years of education: Not on file  . Highest education level: Not on file  Occupational History  . Not on file  Tobacco Use  . Smoking status: Never Smoker  . Smokeless tobacco: Current User    Types: Chew  Vaping Use  . Vaping Use: Never used  Substance and Sexual Activity  . Alcohol use: Yes    Alcohol/week: 0.0 standard drinks    Comment: occas.  . Drug use: No  . Sexual activity: Yes  Other Topics Concern  . Not on file  Social History Narrative  . Not on file   Social Determinants of Health   Financial Resource Strain: Not on file  Food Insecurity: Not on file  Transportation Needs: Not on file  Physical Activity: Not on file  Stress: Not on file  Social Connections: Not on file     Family History: The patient's family history includes Alcohol abuse in his maternal grandfather; Arthritis in his mother; Asthma in his maternal grandfather, maternal grandmother, and mother; COPD in his mother; Depression in his mother; Early death in his maternal grandfather and maternal grandmother; Hearing loss in his maternal grandfather and maternal grandmother; Heart attack in his maternal grandmother; Hyperlipidemia in his mother; Hypertension in his mother; Stroke in his mother.  ROS:   Please see the history of present illness.    All other systems reviewed and are negative.  EKGs/Labs/Other Studies Reviewed:    The following studies were reviewed today: I discussed my findings with the patient at length   Recent Labs: No results found for requested labs within last 8760 hours.  Recent Lipid Panel    Component Value Date/Time   CHOL 137 10/06/2018 0828   CHOL 126 01/13/2017 0904   TRIG 128.0 10/06/2018 0828   HDL 52.80 10/06/2018 0828   HDL 38 (L) 01/13/2017 0904   CHOLHDL 3 10/06/2018 0828   VLDL 25.6 10/06/2018 0828   LDLCALC 59 10/06/2018 0828   LDLCALC 55 01/13/2017 0904   LDLDIRECT 68.0 10/02/2017 1531    Physical  Exam:    VS:  BP (!) 144/78   Pulse 62   Ht 5\' 6"  (1.676 m)   Wt 178 lb 3.2 oz (80.8 kg)   SpO2 98%   BMI 28.76 kg/m     Wt Readings from Last 3 Encounters:  06/30/20 178 lb 3.2 oz (80.8 kg)  02/25/20 175 lb 9.6 oz (79.7 kg)  10/06/18 170 lb 12.8 oz (77.5 kg)     GEN: Patient is in no acute distress HEENT: Normal NECK: No JVD; No carotid bruits LYMPHATICS: No lymphadenopathy CARDIAC: Hear sounds regular, 2/6 systolic murmur at the apex. RESPIRATORY:  Clear to auscultation without rales, wheezing or rhonchi  ABDOMEN: Soft, non-tender, non-distended MUSCULOSKELETAL:  No edema; No deformity  SKIN: Warm and dry NEUROLOGIC:  Alert and oriented x 3  PSYCHIATRIC:  Normal affect   Signed, Garwin Brothers, MD  06/30/2020 2:16 PM    [facility]

## 2021-02-21 ENCOUNTER — Ambulatory Visit: Payer: Managed Care, Other (non HMO) | Admitting: Cardiology

## 2021-02-21 ENCOUNTER — Encounter: Payer: Self-pay | Admitting: Cardiology

## 2021-02-21 ENCOUNTER — Other Ambulatory Visit: Payer: Self-pay

## 2021-02-21 VITALS — BP 134/76 | HR 82 | Ht 66.0 in | Wt 181.0 lb

## 2021-02-21 DIAGNOSIS — E781 Pure hyperglyceridemia: Secondary | ICD-10-CM

## 2021-02-21 DIAGNOSIS — I10 Essential (primary) hypertension: Secondary | ICD-10-CM

## 2021-02-21 NOTE — Progress Notes (Signed)
Cardiology Office Note:    Date:  02/21/2021   ID:  Nathan Fox, DOB May 31, 1965, MRN [id number]  PCP:  Marlyn Corporal, PA  Cardiologist:  Garwin Brothers, MD   Referring MD: Marlyn Corporal, PA    ASSESSMENT:    1. Primary hypertension   2. High triglycerides    PLAN:    In order of problems listed above:  Primary prevention stressed with the patient.  Importance of compliance with diet medication stressed any vocalized understanding.  He was advised to walk at least half an hour a day 5 days a week and he promises to do so. Essential hypertension: Blood pressure stable and diet was emphasized.  Lifestyle modification urged. Mixed dyslipidemia: He had blood work done today and we will get a copy of his blood work.  Patient mentions to me that he will take instructed to send a copy of blood work.  I told him to make sure he hears from his primary care about his blood work reports and he understands.  I reviewed blood work reports from [facility] and they were unremarkable. Patient will be seen in follow-up appointment in 12 months or earlier if the patient has any concerns    Medication Adjustments/Labs and Tests Ordered: Current medicines are reviewed at length with the patient today.  Concerns regarding medicines are outlined above.  No orders of the defined types were placed in this encounter.  No orders of the defined types were placed in this encounter.    No chief complaint on file.    History of Present Illness:    Nathan Fox is a 56 y.o. male.  Patient has past medical history of essential hypertension and hypertriglyceridemia.  He denies any problems at this time and takes care of activities of daily living.  No chest pain orthopnea or PND.  At the time of my evaluation, the patient is alert awake oriented and in no distress.  He leads a sedentary lifestyle.  He is retired.  Past Medical History:  Diagnosis Date   Abdominal pain, right upper quadrant     Acid reflux 08/30/2014   Allergy to alpha-gal 02/04/2018   Anxiety and depression 12/24/2017   Cardiac murmur 02/25/2020   Change in bowel habits    Chicken pox    Columnar epithelial-lined lower esophagus    Depression    Gastric pain    Gastritis    GERD (gastroesophageal reflux disease)    High triglycerides 12/24/2017   Hypertension    Intestinal metaplasia of gastric mucosa    Palpitations 02/25/2020   Post-nasal drip 02/09/2018   PTSD (post-traumatic stress disorder)    Second degree hemorrhoids    Stomach irritation    Throat dry 02/09/2018   Tick bite 12/24/2017    Past Surgical History:  Procedure Laterality Date   COLONOSCOPY WITH PROPOFOL N/A 09/23/2014   Procedure: COLONOSCOPY WITH PROPOFOL;  Surgeon: Midge Minium, MD;  Location: [facility];  Service: Endoscopy;  Laterality: N/A;   ESOPHAGOGASTRODUODENOSCOPY (EGD) WITH PROPOFOL N/A 09/23/2014   Procedure: ESOPHAGOGASTRODUODENOSCOPY (EGD) WITH PROPOFOL;  Surgeon: Midge Minium, MD;  Location: [facility];  Service: Endoscopy;  Laterality: N/A;  gastric biopsy   ESOPHAGOGASTRODUODENOSCOPY (EGD) WITH PROPOFOL N/A 05/07/2017   Procedure: ESOPHAGOGASTRODUODENOSCOPY (EGD) WITH PROPOFOL;  Surgeon: Pasty Spillers, MD;  Location: [facility];  Service: Endoscopy;  Laterality: N/A;   HERNIA REPAIR     x 2    TONSILLECTOMY  WRIST SURGERY Left    cyst    Current Medications: Current Meds  Medication Sig   ALPRAZolam (XANAX) 0.25 MG tablet Take 0.25 mg by mouth 2 (two) times daily as needed for anxiety.   azelastine (ASTELIN) 0.1 % nasal spray Place 1 spray into both nostrils as needed for allergies or rhinitis.   EPINEPHrine 0.3 mg/0.3 mL IJ SOAJ injection Inject 0.3 mg into the muscle as needed for anaphylaxis.   fluticasone (FLONASE) 50 MCG/ACT nasal spray Place 1 spray into both nostrils as needed for allergies or rhinitis.   hydrochlorothiazide (MICROZIDE) 12.5 MG capsule Take 12.5 mg by mouth daily.    losartan (COZAAR) 100 MG tablet Take 100 mg by mouth daily.   omeprazole (PRILOSEC) 20 MG capsule Take 20 mg by mouth daily.     Allergies:   Bee venom   Social History   Socioeconomic History   Marital status: Married    Spouse name: Not on file   Number of children: Not on file   Years of education: Not on file   Highest education level: Not on file  Occupational History   Not on file  Tobacco Use   Smoking status: Never   Smokeless tobacco: Current    Types: Chew  Vaping Use   Vaping Use: Never used  Substance and Sexual Activity   Alcohol use: Yes    Alcohol/week: 0.0 standard drinks    Comment: occas.   Drug use: No   Sexual activity: Yes  Other Topics Concern   Not on file  Social History Narrative   Not on file   Social Determinants of Health   Financial Resource Strain: Not on file  Food Insecurity: Not on file  Transportation Needs: Not on file  Physical Activity: Not on file  Stress: Not on file  Social Connections: Not on file     Family History: The patient's family history includes Alcohol abuse in his maternal grandfather; Arthritis in his mother; Asthma in his maternal grandfather, maternal grandmother, and mother; COPD in his mother; Depression in his mother; Early death in his maternal grandfather and maternal grandmother; Hearing loss in his maternal grandfather and maternal grandmother; Heart attack in his maternal grandmother; Hyperlipidemia in his mother; Hypertension in his mother; Stroke in his mother.  ROS:   Please see the history of present illness.    All other systems reviewed and are negative.  EKGs/Labs/Other Studies Reviewed:    The following studies were reviewed today: I discussed my findings with the patient at length.   Recent Labs: No results found for requested labs within last 8760 hours.  Recent Lipid Panel    Component Value Date/Time   CHOL 137 10/06/2018 0828   CHOL 126 01/13/2017 0904   TRIG 128.0 10/06/2018  0828   HDL 52.80 10/06/2018 0828   HDL 38 (L) 01/13/2017 0904   CHOLHDL 3 10/06/2018 0828   VLDL 25.6 10/06/2018 0828   LDLCALC 59 10/06/2018 0828   LDLCALC 55 01/13/2017 0904   LDLDIRECT 68.0 10/02/2017 1531    Physical Exam:    VS:  BP 134/76    Pulse 82    Ht 5\' 6"  (1.676 m)    Wt 181 lb (82.1 kg)    SpO2 97%    BMI 29.21 kg/m     Wt Readings from Last 3 Encounters:  02/21/21 181 lb (82.1 kg)  06/30/20 178 lb 3.2 oz (80.8 kg)  02/25/20 175 lb 9.6 oz (79.7 kg)  GEN: Patient is in no acute distress HEENT: Normal NECK: No JVD; No carotid bruits LYMPHATICS: No lymphadenopathy CARDIAC: Hear sounds regular, 2/6 systolic murmur at the apex. RESPIRATORY:  Clear to auscultation without rales, wheezing or rhonchi  ABDOMEN: Soft, non-tender, non-distended MUSCULOSKELETAL:  No edema; No deformity  SKIN: Warm and dry NEUROLOGIC:  Alert and oriented x 3 PSYCHIATRIC:  Normal affect   Signed, Garwin Brothers, MD  02/21/2021 3:44 PM    [facility]

## 2021-02-21 NOTE — Patient Instructions (Signed)
Medication Instructions:  °Your physician recommends that you continue on your current medications as directed. Please refer to the Current Medication list given to you today. ° °*If you need a refill on your cardiac medications before your next appointment, please call your pharmacy* ° ° °Lab Work: °None °If you have labs (blood work) drawn today and your tests are completely normal, you will receive your results only by: °MyChart Message (if you have MyChart) OR °A paper copy in the mail °If you have any lab test that is abnormal or we need to change your treatment, we will call you to review the results. ° ° °Testing/Procedures: °None ° ° °Follow-Up: °At [facility], you and your health needs are our priority.  As part of our continuing mission to provide you with exceptional heart care, we have created designated Provider Care Teams.  These Care Teams include your primary Cardiologist (physician) and Advanced Practice Providers (APPs -  Physician Assistants and Nurse Practitioners) who all work together to provide you with the care you need, when you need it. ° °We recommend signing up for the patient portal called "MyChart".  Sign up information is provided on this After Visit Summary.  MyChart is used to connect with patients for Virtual Visits (Telemedicine).  Patients are able to view lab/test results, encounter notes, upcoming appointments, etc.  Non-urgent messages can be sent to your provider as well.   °To learn more about what you can do with MyChart, go to [email or web address].   ° °Your next appointment:   °1 year(s) ° °The format for your next appointment:   °In Person ° °Provider:   °Rajan Revankar, MD  ° ° °Other Instructions ° ° °

## 2023-09-09 ENCOUNTER — Ambulatory Visit: Admitting: Podiatry

## 2023-09-09 ENCOUNTER — Encounter: Payer: Self-pay | Admitting: Podiatry

## 2023-09-09 ENCOUNTER — Ambulatory Visit (INDEPENDENT_AMBULATORY_CARE_PROVIDER_SITE_OTHER)

## 2023-09-09 DIAGNOSIS — M216X2 Other acquired deformities of left foot: Secondary | ICD-10-CM | POA: Diagnosis not present

## 2023-09-09 DIAGNOSIS — M722 Plantar fascial fibromatosis: Secondary | ICD-10-CM

## 2023-09-09 DIAGNOSIS — M778 Other enthesopathies, not elsewhere classified: Secondary | ICD-10-CM | POA: Diagnosis not present

## 2023-09-09 MED ORDER — MELOXICAM 15 MG PO TABS: 15.0000 mg | ORAL_TABLET | Freq: Every day | ORAL | 0 refills | Status: AC

## 2023-09-09 MED ORDER — TRIAMCINOLONE ACETONIDE 10 MG/ML IJ SUSP
10.0000 mg | Freq: Once | INTRAMUSCULAR | Status: AC
  Administered 2023-09-09: 10 mg

## 2023-09-09 NOTE — Patient Instructions (Signed)

## 2023-09-09 NOTE — Progress Notes (Unsigned)
 Chief Complaint  Patient presents with   Foot Pain    Left heel pain, on the medial aspect of the heel. Been hurting him for about a month, he stepped wrong on a step. Xrays up, Not diabetic and no anti coag.     HPI: 58 y.o. male presenting today with c/o pain in the bottom of the left heel.  He has been going.  Reports that static dyskinesia.  Increased pain with ambulation and prolonged weightbearing activity.  Reports stabbing shooting pain to the medial heel.  Past Medical History:  Diagnosis Date   Abdominal pain, right upper quadrant    Acid reflux 08/30/2014   Allergy  to alpha-gal 02/04/2018   Anxiety and depression 12/24/2017   Cardiac murmur 02/25/2020   Change in bowel habits    Chicken pox    Columnar epithelial-lined lower esophagus    Depression    Gastric pain    Gastritis    GERD (gastroesophageal reflux disease)    High triglycerides 12/24/2017   Hypertension    Intestinal metaplasia of gastric mucosa    Palpitations 02/25/2020   Post-nasal drip 02/09/2018   PTSD (post-traumatic stress disorder)    Second degree hemorrhoids    Stomach irritation    Throat dry 02/09/2018   Tick bite 12/24/2017   Past Surgical History:  Procedure Laterality Date   COLONOSCOPY WITH PROPOFOL  N/A 09/23/2014   Procedure: COLONOSCOPY WITH PROPOFOL ;  Surgeon: Rogelia Copping, MD;  Location: [facility];  Service: Endoscopy;  Laterality: N/A;   ESOPHAGOGASTRODUODENOSCOPY (EGD) WITH PROPOFOL  N/A 09/23/2014   Procedure: ESOPHAGOGASTRODUODENOSCOPY (EGD) WITH PROPOFOL ;  Surgeon: Rogelia Copping, MD;  Location: [facility];  Service: Endoscopy;  Laterality: N/A;  gastric biopsy   ESOPHAGOGASTRODUODENOSCOPY (EGD) WITH PROPOFOL  N/A 05/07/2017   Procedure: ESOPHAGOGASTRODUODENOSCOPY (EGD) WITH PROPOFOL ;  Surgeon: Janalyn Keene NOVAK, MD;  Location: [facility];  Service: Endoscopy;  Laterality: N/A;   HERNIA REPAIR     x 2    TONSILLECTOMY     WRIST SURGERY Left    cyst   Allergies   Allergen Reactions   Bee Venom Swelling     Physical Exam: General: The patient is alert and oriented x3 in no acute distress.  Dermatology:  No ecchymosis, erythema, or edema bilateral.  No open lesions.    Vascular: Palpable pedal pulses bilaterally. Capillary refill within normal limits.  No appreciable edema.    Neurological: Epicritic sensation is intact  Musculoskeletal Exam:  There is pain on palpation of the plantarmedial & plantarcentral aspect of the left heel.  No gaps or nodules within the plantar fascia.  Positive Windlass mechanism bilateral.  Antalgic gait noted with first few steps upon standing.  No pain on palpation of achilles tendon bilateral.  Ankle df less than 10 degrees with knee extended b/l.  Radiographic Exam: Left foot 3 views weightbearing  Normal osseous mineralization. Joint spaces preserved.  No fractures noted. Overall rectus foot alignment noted.  Assessment/Plan of Care: 1. Plantar fasciitis of left foot   2. Equinus deformity of left foot     Meds ordered this encounter  Medications   triamcinolone  acetonide (KENALOG ) 10 MG/ML injection 10 mg   meloxicam  (MOBIC ) 15 MG tablet    Sig: Take 1 tablet (15 mg total) by mouth daily.    Dispense:  30 tablet    Refill:  0   None  -Reviewed etiology of plantar fasciitis with patient.  Discussed contributing pathology of tight heel cord.  Discussed treatment options with  patient today, including cortisone injection, NSAID course of treatment, stretching exercises, physical therapy, use of night splint, rest, icing the heel, arch supports/orthotics, and supportive shoe gear.    With the patient's verbal consent, a corticosteroid injection was administered to the left heel, consisting of a mixture of 1% lidocaine  plain, 0.5% Sensorcaine plain, and Kenalog -10 for a total of 2.0 cc administered.  A Band-aid was applied. Patient tolerated injection well.  Plantar fascia brace dispensed to the patient today.   This is an ankle gauntlet device with elastic strap to offload the medial band plantar fascia and medial plantar arch.  Stretching exercises discussed at length with patient with written instructions dispensed  Course of oral meloxicam  sent to patient's pharmacy.  Return in about 2 weeks (around 09/23/2023) for Plantar Fasciitis.   Ethan LITTIE Saddler, DPM, AACFAS [facility]     [address] [place], [place] [address]                Office [phone]  Fax [phone]

## 2023-10-07 ENCOUNTER — Ambulatory Visit: Admitting: Podiatry
